# Patient Record
Sex: Female | Born: 1955 | Race: White | Hispanic: No | Marital: Married | Smoking: Never smoker
Health system: Southern US, Community
[De-identification: ages and names within clinical notes are randomized; demographics above are authoritative.]

## PROBLEM LIST (undated history)

## (undated) DIAGNOSIS — R0602 Shortness of breath: Secondary | ICD-10-CM

## (undated) DIAGNOSIS — U071 COVID-19: Secondary | ICD-10-CM

## (undated) DIAGNOSIS — J45909 Unspecified asthma, uncomplicated: Secondary | ICD-10-CM

## (undated) DIAGNOSIS — R51 Headache: Secondary | ICD-10-CM

## (undated) DIAGNOSIS — G4730 Sleep apnea, unspecified: Secondary | ICD-10-CM

## (undated) HISTORY — PX: BREAST SURGERY: SHX581

## (undated) HISTORY — PX: FOOT SURGERY: SHX648

## (undated) HISTORY — PX: WRIST SURGERY: SHX841

## (undated) HISTORY — PX: TONSILLECTOMY: SUR1361

## (undated) HISTORY — PX: KNEE ARTHROSCOPY: SUR90

## (undated) HISTORY — PX: ABDOMINAL HYSTERECTOMY: SHX81

---

## 1998-05-27 ENCOUNTER — Other Ambulatory Visit: Admission: RE | Admit: 1998-05-27 | Discharge: 1998-05-27 | Payer: Self-pay | Admitting: Specialist

## 1999-04-08 ENCOUNTER — Ambulatory Visit: Admission: RE | Admit: 1999-04-08 | Discharge: 1999-04-08 | Payer: Self-pay | Admitting: Occupational Medicine

## 1999-04-08 ENCOUNTER — Encounter: Payer: Self-pay | Admitting: Occupational Medicine

## 1999-04-15 ENCOUNTER — Encounter: Admission: RE | Admit: 1999-04-15 | Discharge: 1999-07-14 | Payer: Self-pay | Admitting: Occupational Medicine

## 2000-08-02 ENCOUNTER — Emergency Department (HOSPITAL_COMMUNITY): Admission: EM | Admit: 2000-08-02 | Discharge: 2000-08-02 | Payer: Self-pay | Admitting: *Deleted

## 2000-10-26 ENCOUNTER — Encounter: Payer: Self-pay | Admitting: Specialist

## 2000-10-26 ENCOUNTER — Encounter: Admission: RE | Admit: 2000-10-26 | Discharge: 2000-10-26 | Payer: Self-pay | Admitting: Specialist

## 2000-11-28 ENCOUNTER — Other Ambulatory Visit: Admission: RE | Admit: 2000-11-28 | Discharge: 2000-11-28 | Payer: Self-pay | Admitting: Specialist

## 2003-04-19 ENCOUNTER — Ambulatory Visit (HOSPITAL_BASED_OUTPATIENT_CLINIC_OR_DEPARTMENT_OTHER): Admission: RE | Admit: 2003-04-19 | Discharge: 2003-04-19 | Payer: Self-pay | Admitting: Family Medicine

## 2003-11-20 ENCOUNTER — Ambulatory Visit (HOSPITAL_COMMUNITY): Admission: RE | Admit: 2003-11-20 | Discharge: 2003-11-20 | Payer: Self-pay | Admitting: Family Medicine

## 2004-02-01 ENCOUNTER — Ambulatory Visit (HOSPITAL_COMMUNITY): Admission: RE | Admit: 2004-02-01 | Discharge: 2004-02-01 | Payer: Self-pay | Admitting: Internal Medicine

## 2004-04-04 ENCOUNTER — Ambulatory Visit (HOSPITAL_COMMUNITY): Admission: RE | Admit: 2004-04-04 | Discharge: 2004-04-04 | Payer: Self-pay | Admitting: *Deleted

## 2004-06-21 ENCOUNTER — Encounter: Admission: RE | Admit: 2004-06-21 | Discharge: 2004-06-21 | Payer: Self-pay | Admitting: Internal Medicine

## 2004-08-25 ENCOUNTER — Encounter: Admission: RE | Admit: 2004-08-25 | Discharge: 2004-08-25 | Payer: Self-pay | Admitting: Otolaryngology

## 2005-01-12 ENCOUNTER — Ambulatory Visit: Payer: Self-pay | Admitting: Internal Medicine

## 2005-01-19 ENCOUNTER — Ambulatory Visit (HOSPITAL_COMMUNITY): Admission: RE | Admit: 2005-01-19 | Discharge: 2005-01-19 | Payer: Self-pay | Admitting: Internal Medicine

## 2005-01-20 ENCOUNTER — Ambulatory Visit (HOSPITAL_COMMUNITY): Admission: RE | Admit: 2005-01-20 | Discharge: 2005-01-20 | Payer: Self-pay | Admitting: Internal Medicine

## 2005-02-09 ENCOUNTER — Ambulatory Visit: Payer: Self-pay | Admitting: Internal Medicine

## 2008-01-14 ENCOUNTER — Ambulatory Visit: Payer: Self-pay | Admitting: Internal Medicine

## 2009-02-19 ENCOUNTER — Emergency Department (HOSPITAL_COMMUNITY): Admission: EM | Admit: 2009-02-19 | Discharge: 2009-02-19 | Payer: Self-pay | Admitting: Emergency Medicine

## 2009-05-18 ENCOUNTER — Emergency Department (HOSPITAL_COMMUNITY): Admission: EM | Admit: 2009-05-18 | Discharge: 2009-05-18 | Payer: Self-pay | Admitting: Emergency Medicine

## 2009-05-27 ENCOUNTER — Encounter: Admission: RE | Admit: 2009-05-27 | Discharge: 2009-05-27 | Payer: Self-pay | Admitting: Orthopedic Surgery

## 2009-06-08 ENCOUNTER — Emergency Department (HOSPITAL_COMMUNITY): Admission: EM | Admit: 2009-06-08 | Discharge: 2009-06-08 | Payer: Self-pay | Admitting: Emergency Medicine

## 2009-06-11 ENCOUNTER — Encounter (HOSPITAL_COMMUNITY): Admission: RE | Admit: 2009-06-11 | Discharge: 2009-09-03 | Payer: Self-pay | Admitting: Emergency Medicine

## 2009-06-30 ENCOUNTER — Encounter: Admission: RE | Admit: 2009-06-30 | Discharge: 2009-06-30 | Payer: Self-pay | Admitting: Orthopedic Surgery

## 2009-08-31 IMAGING — RF DG MYELOGRAM LUMBAR
13 of 16 series · 13 of 16 positions shown · IV contrast (omnipaque)
Comparison: Radiography 05/18/2009

CLINICAL DATA: Left leg and thigh pain.  Low back pain.

 MYELOGRAM INJECTION
TECHNIQUE: Informed consent was obtained from the patient prior to
the procedure, including potential complications of headache,
allergy, infection and pain.  A timeout procedure was performed.
With the patient prone, the lower back was prepped with Betadine.
1% Lidocaine was used for local anesthesia.  Lumbar puncture was
performed at the right L3-4 level using a 22 gauge needle with
return of clear CSF.  18 ml of Omnipaque 186was injected into the
subarachnoid space .
TECHNIQUE: Following injection of intrathecal Omnipaque contrast,
spine imaging in multiple projections was performed using
fluoroscopy.
Fluoroscopy Time: 1 minute 25 seconds .
TECHNIQUE: CT imaging of the lumbar spine was performed after
intrathecal contrast administration.  Multiplanar CT image
reconstructions were also generated.

[Series 1: (hospital) · 1 of 1 slices shown]
[im 1/1]
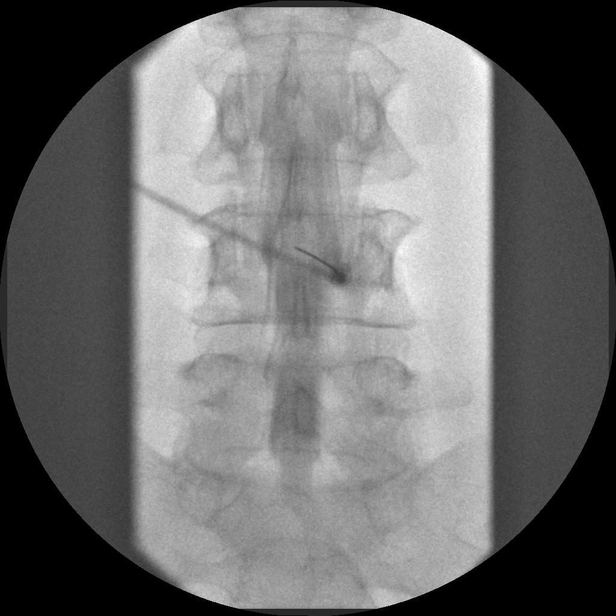

[Series 2: myelogram  white · 1 of 1 slices shown (1 of 12)]
[im 1/1]
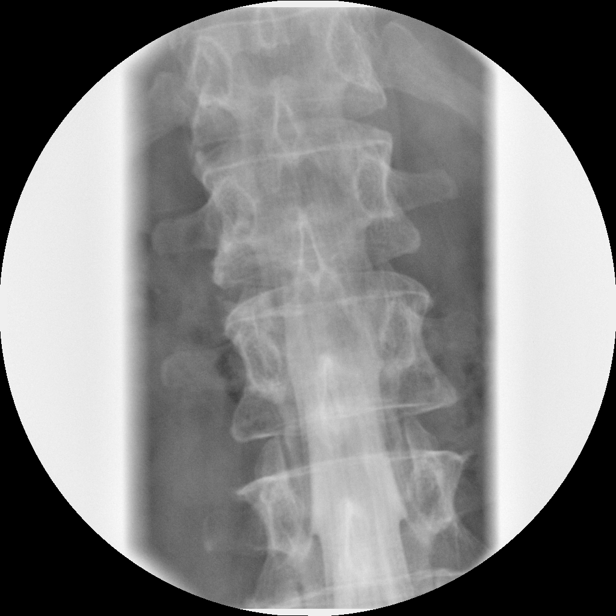

[Series 4: myelogram  white · 1 of 1 slices shown (2 of 12)]
[im 1/1]
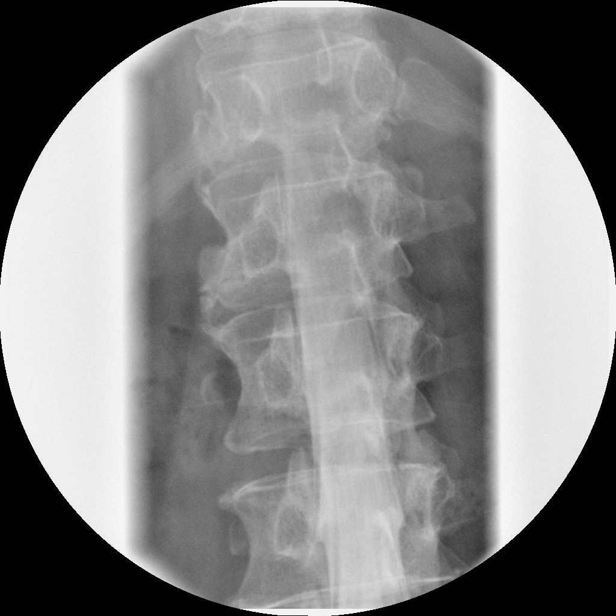

[Series 5: myelogram  white · 1 of 1 slices shown (3 of 12)]
[im 1/1]
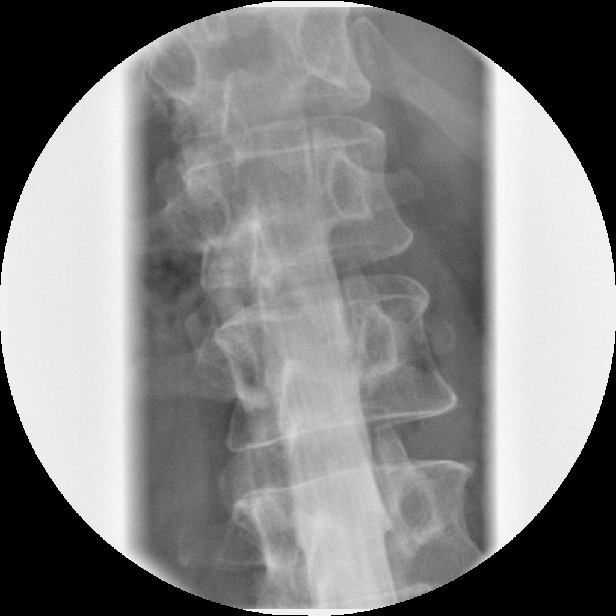

[Series 6: myelogram  white · 1 of 1 slices shown (4 of 12)]
[im 1/1]
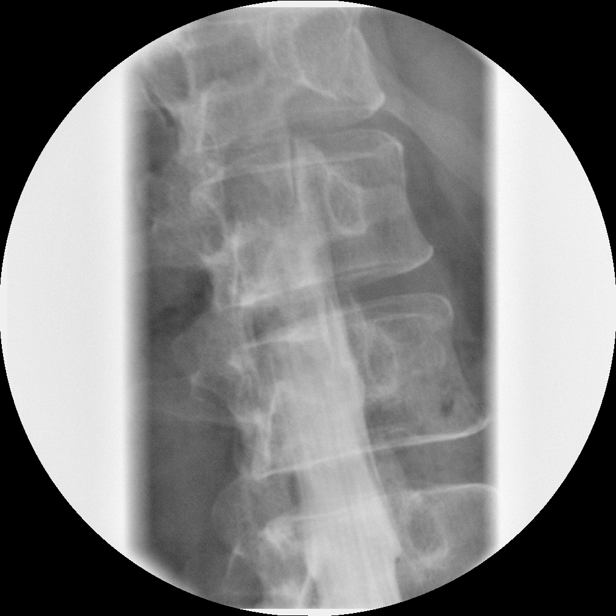

[Series 7: myelogram  white · 1 of 1 slices shown (5 of 12)]
[im 1/1]
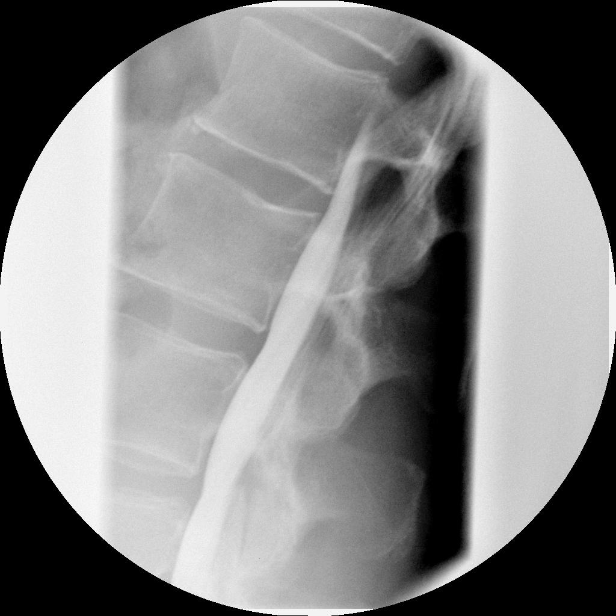

[Series 9: myelogram  white · 1 of 1 slices shown (6 of 12)]
[im 1/1]
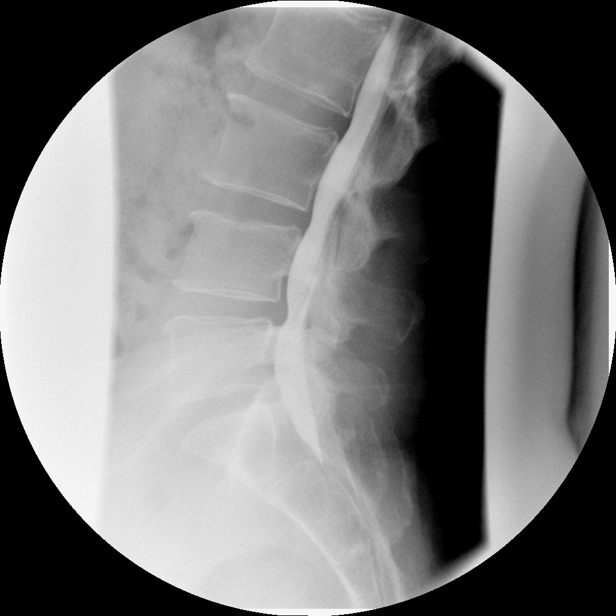

[Series 10: myelogram  white · 1 of 1 slices shown (7 of 12)]
[im 1/1]
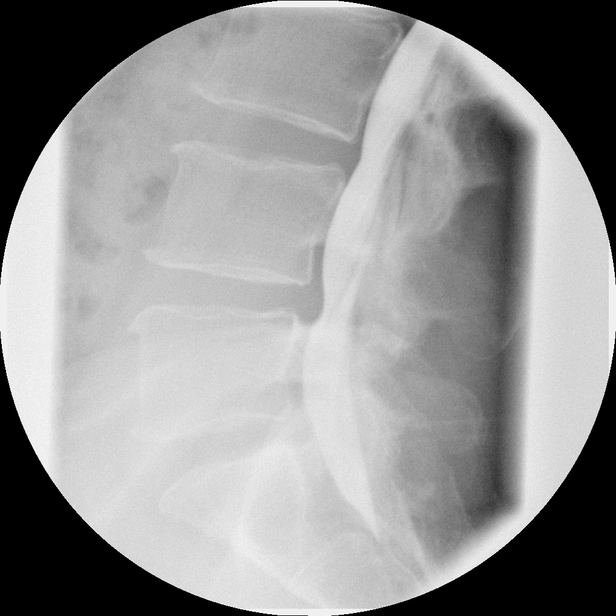

[Series 11: myelogram  white · 1 of 1 slices shown (8 of 12)]
[im 1/1]
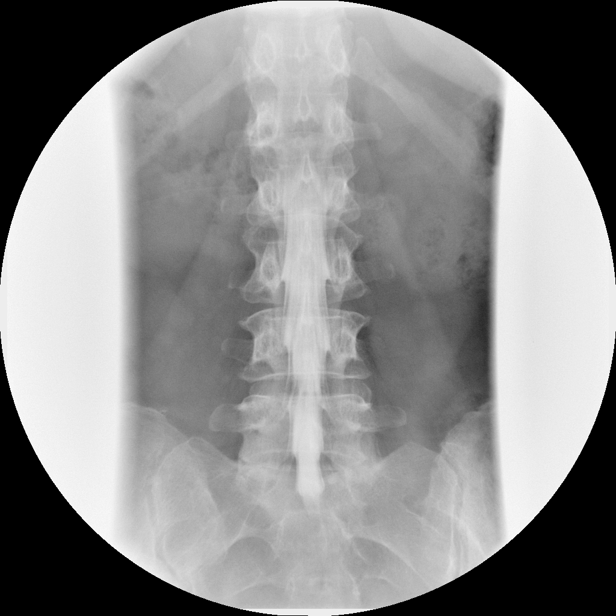

[Series 12: myelogram  white · 1 of 1 slices shown (9 of 12)]
[im 1/1]
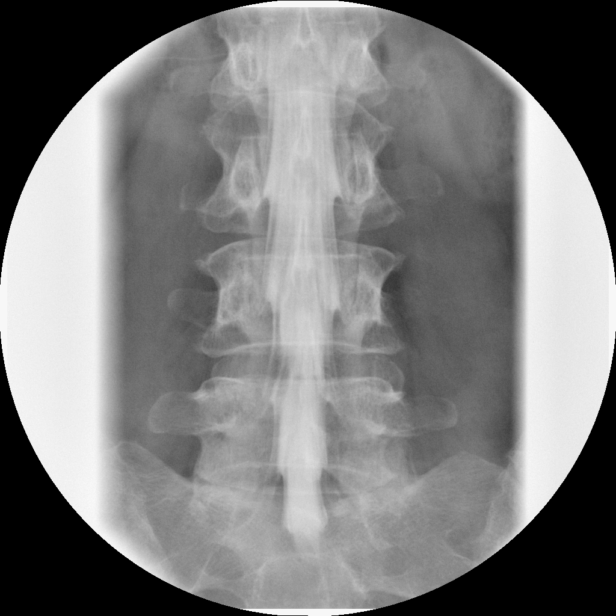

[Series 13: myelogram  white · 1 of 1 slices shown (10 of 12)]
[im 1/1]
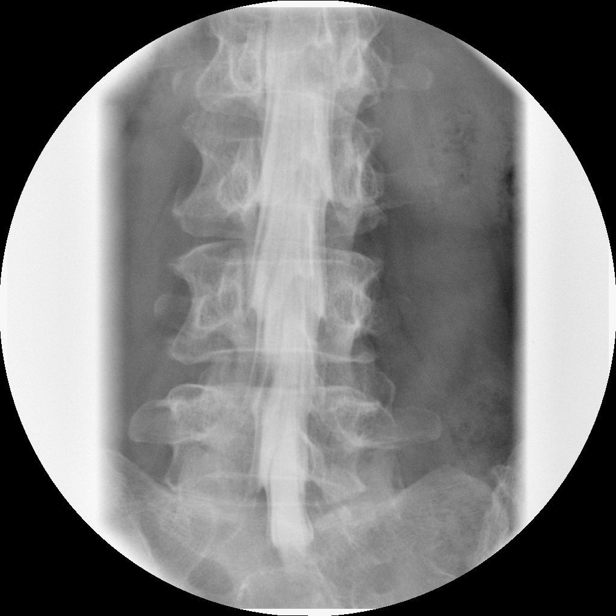

[Series 15: myelogram  white · 1 of 1 slices shown (11 of 12)]
[im 1/1]
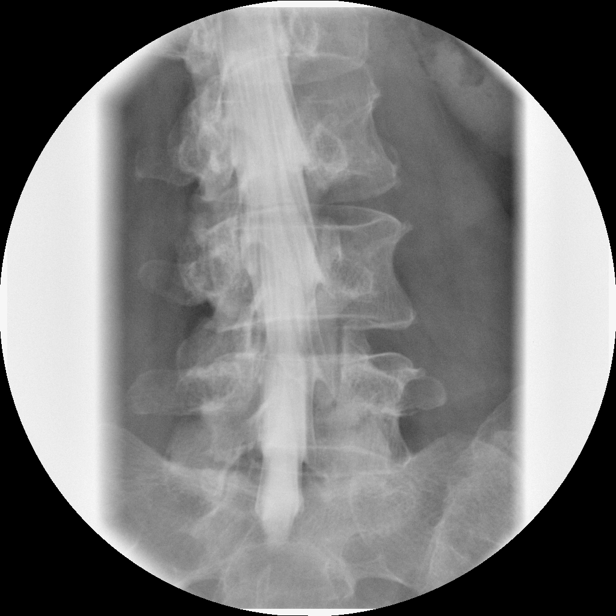

[Series 16: myelogram  white · 1 of 1 slices shown (12 of 12)]
[im 1/1]
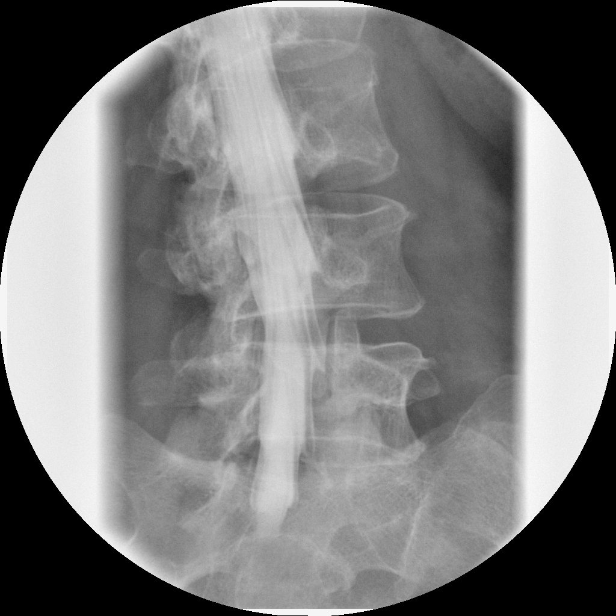

[13 of 16 positions shown; findings below may reference images not displayed]

IMPRESSION: Successful injection of  intrathecal contrast for myelography.

MYELOGRAM LUMBAR
FINDINGS: There is a small anterior extradural defect at L4-5.
There is mild narrowing of both lateral recesses at L4-5.  No other
stenosis is evident.
IMPRESSION: Small anterior extradural defect at L4-5 with mild narrowing of the
lateral recesses.

CT MYELOGRAPHY LUMBAR SPINE
FINDINGS: L1-2:  Normal.  Anterior osteophytes of no significance.

L2-3:  Normal

L3-4:  Mild bulging of the disc.  Mild facet degeneration
bilaterally.  No compressive stenosis.

L4-5:  Moderate circumferential disc protrusion.  Mild facet and
ligamentous hypertrophy.  Narrowing of the lateral recesses, left
more than right.  The left L5 nerve root could be affected.

L5-S1:  The disc is degenerated.  There is circumferential annular
bulging with calcification on the right side.  There is facet
arthropathy bilaterally, more on the right than the left.  There is
mild foraminal narrowing without definite compression of either L5
nerve root.  There is a calcified entity within the proximal
foramen on the right that looks like a piece of calcified extruded
disc material or conceivably be some material related to the L5-S1
facet joint.  This could focally compress the right S1 nerve root.
The possibility does exist this could represent a root sleeve cyst.
In order to sort this out, one could perform a limited CT of the
region after the contrast has cleared.
IMPRESSION: L3-4:  Disc bulge.  Facet degeneration.  No compressive stenosis.

L4-5:  Circumferential disc protrusion.  Facet and ligamentous
hypertrophy.  Narrowing of the lateral recesses, left more than
right.  Left L5 nerve root could be affected.

L5-S1:  Chronic annular bulging with calcification on the right.
Facet arthropathy right worse than left.  Calcified entity within
the proximal foramen on the right that could compress the right S1
nerve root.  This could be a calcified disc fragment or could be
some material derived from the degenerated facet joint on the
right. The possibility does exist that we could be full by a root
sleeve cyst.  In order to sort this out, one could perform a
limited CT after the contrast has cleared.

## 2009-10-13 ENCOUNTER — Ambulatory Visit (HOSPITAL_COMMUNITY): Admission: RE | Admit: 2009-10-13 | Discharge: 2009-10-13 | Payer: Self-pay | Admitting: Orthopedic Surgery

## 2011-03-15 LAB — COMPREHENSIVE METABOLIC PANEL
ALT: 26 U/L (ref 0–35)
AST: 22 U/L (ref 0–37)
Albumin: 3.2 g/dL — ABNORMAL LOW (ref 3.5–5.2)
Alkaline Phosphatase: 124 U/L — ABNORMAL HIGH (ref 39–117)
BUN: 13 mg/dL (ref 6–23)
CO2: 30 mEq/L (ref 19–32)
Calcium: 9.4 mg/dL (ref 8.4–10.5)
Chloride: 105 mEq/L (ref 96–112)
Creatinine, Ser: 0.87 mg/dL (ref 0.4–1.2)
GFR calc Af Amer: >60 mL/min (ref >60)
GFR calc non Af Amer: >60 mL/min (ref >60)
Glucose, Bld: 127 mg/dL — ABNORMAL HIGH (ref 70–99)
Potassium: 4.3 mEq/L (ref 3.5–5.1)
Sodium: 143 mEq/L (ref 135–145)
Total Bilirubin: 0.4 mg/dL (ref 0.3–1.2)
Total Protein: 6.6 g/dL (ref 6.0–8.3)

## 2011-03-15 LAB — CBC
HCT: 34.8 % — ABNORMAL LOW (ref 36.0–46.0)
Hemoglobin: 11.3 g/dL — ABNORMAL LOW (ref 12.0–15.0)
MCHC: 32.3 g/dL (ref 30.0–36.0)
MCV: 80.9 fL (ref 78.0–100.0)
Platelets: 309 10*3/uL (ref 150–400)
RBC: 4.31 MIL/uL (ref 3.87–5.11)
RDW: 16.5 % — ABNORMAL HIGH (ref 11.5–15.5)
WBC: 16.7 10*3/uL — ABNORMAL HIGH (ref 4.0–10.5)

## 2013-05-19 ENCOUNTER — Encounter (HOSPITAL_COMMUNITY): Payer: Self-pay | Admitting: Pharmacy Technician

## 2013-05-27 ENCOUNTER — Encounter (HOSPITAL_COMMUNITY): Payer: Self-pay

## 2013-05-27 ENCOUNTER — Encounter (HOSPITAL_COMMUNITY)
Admission: RE | Admit: 2013-05-27 | Discharge: 2013-05-27 | Disposition: A | Discharge status: Home / Self Care | Payer: Managed Care, Other (non HMO) | Source: Ambulatory Visit | Attending: Orthopedic Surgery | Admitting: Orthopedic Surgery

## 2013-05-27 ENCOUNTER — Encounter (HOSPITAL_COMMUNITY)
Admission: RE | Admit: 2013-05-27 | Discharge: 2013-05-27 | Disposition: A | Discharge status: Home / Self Care | Payer: Managed Care, Other (non HMO) | Source: Ambulatory Visit | Attending: Anesthesiology | Admitting: Anesthesiology

## 2013-05-27 HISTORY — DX: Headache: R51

## 2013-05-27 HISTORY — DX: Shortness of breath: R06.02

## 2013-05-27 HISTORY — DX: Unspecified asthma, uncomplicated: J45.909

## 2013-05-27 HISTORY — DX: Sleep apnea, unspecified: G47.30

## 2013-05-27 LAB — CBC
HCT: 38.3 % (ref 36.0–46.0)
Hemoglobin: 12.6 g/dL (ref 12.0–15.0)
MCH: 28.1 pg (ref 26.0–34.0)
MCHC: 32.9 g/dL (ref 30.0–36.0)
MCV: 85.5 fL (ref 78.0–100.0)
Platelets: 262 10*3/uL (ref 150–400)
RBC: 4.48 MIL/uL (ref 3.87–5.11)
RDW: 14.1 % (ref 11.5–15.5)
WBC: 7.8 10*3/uL (ref 4.0–10.5)

## 2013-05-27 LAB — SURGICAL PCR SCREEN
MRSA, PCR: NEGATIVE
Staphylococcus aureus: NEGATIVE

## 2013-05-27 NOTE — Progress Notes (Signed)
Sleep Study requested from [facility].

## 2013-05-27 NOTE — Pre-Procedure Instructions (Signed)
Virginia Bailey  05/27/2013   Your procedure is scheduled on:  05-30-2013  Friday    Report to [facility] at 11:00 AM.Enter thru Main Entrance and take the [facility] to the 3rd floor. Check in at the Short Stay Desk.    Call this number if you have problems the morning of surgery: [phone]   Remember:   Do not eat food or drink liquids after midnight.    Take these medicines the morning of surgery with A SIP OF WATER: none   Do not wear jewelry, make-up or nail polish.  Do not wear lotions, powders, or perfumes.   Do not shave 48 hours prior to surgery.   Do not bring valuables to the hospital.  [facility] is not responsible for any belongings or valuables.   Contacts, dentures or bridgework may not be worn into surgery.  Leave suitcase in the car. After surgery it may be brought to your room.   For patients admitted to the hospital, checkout time is 11:00 AM the day of discharge.   Patients discharged the day of surgery will not be allowed to drive home.    Name and phone number of your driver:     Special Instructions: Shower using CHG 2 nights before surgery and the night before surgery.  If you shower the day of surgery use CHG.  Use special wash - you have one bottle of CHG for all showers.  You should use approximately 1/3 of the bottle for each shower.   Please read over the following fact sheets that you were given: Pain Booklet and Surgical Site Infection Prevention

## 2013-05-28 ENCOUNTER — Encounter (HOSPITAL_COMMUNITY): Payer: Self-pay

## 2013-05-28 NOTE — Progress Notes (Signed)
Anesthesia Chart Review:  Patient is a 57 year old female scheduled for right knee arthroscopy, aspiration of Baker's Cyst on 05/30/13 by Dr. Ranell Patrick.  History includes non-smoker, obesity, asthma, OSA, migraines, hysterectomy, tonsillectomy, breast cyst excision.  PCP is Dr. Doreatha Martin who cleared patient for this procedure.    CXR on 05/27/13 showed no edema or consolidation.  Preoperative CBC WNL.  Velna Ochs [facility]/Anesthesiology Phone [phone] 05/28/2013 11:00 AM

## 2013-05-28 NOTE — H&P (Signed)
Virginia Bailey is an 57 y.o. female.    Chief Complaint: right knee pain  HPI: Pt is a 57 y.o. female complaining of right knee pain for several months. Pain had continually increased since the beginning.  Pt has tried various conservative treatments which have failed to alleviate their symptoms, including shots and exercises. Various options are discussed with the patient. Risks, benefits and expectations were discussed with the patient. Patient understand the risks, benefits and expectations and wishes to proceed with surgery.   PCP:  Doreatha Martin, MD  D/C Plans:  Home   PMH: Past Medical History  Diagnosis Date  . Shortness of breath     restrictive pulmonary disease  . Asthma   . Sleep apnea     CPAP  . Seizures     migraines    PSH: Past Surgical History  Procedure Laterality Date  . Abdominal hysterectomy    . Tonsillectomy    . Breast surgery Bilateral     cyst removes  . Knee arthroscopy Left   . Foot surgery Left     removal of tumors  . Wrist surgery Right     cyst removed    Social History:  reports that she has never smoked. She does not have any smokeless tobacco history on file. She reports that  drinks alcohol. She reports that she does not use illicit drugs.  Allergies:  No Known Allergies  Medications: No current facility-administered medications for this encounter.   No current outpatient prescriptions on file.    Results for orders placed during the hospital encounter of 05/27/13 (from the past 48 hour(s))  SURGICAL PCR SCREEN     Status: None   Collection Time    05/27/13  1:35 PM      Result Value Range   MRSA, PCR NEGATIVE  NEGATIVE   Staphylococcus aureus NEGATIVE  NEGATIVE   Comment:            The Xpert SA Assay (FDA     approved for NASAL specimens     in patients over 42 years of age),     is one component of     a comprehensive surveillance     program.  Test performance has     been validated by [employer] for  patients greater     than or equal to 42 year old.     It is not intended     to diagnose infection nor to     guide or monitor treatment.  CBC     Status: None   Collection Time    05/27/13  1:36 PM      Result Value Range   WBC 7.8  4.0 - 10.5 K/uL   RBC 4.48  3.87 - 5.11 MIL/uL   Hemoglobin 12.6  12.0 - 15.0 g/dL   HCT 13.0  86.5 - 78.4 %   MCV 85.5  78.0 - 100.0 fL   MCH 28.1  26.0 - 34.0 pg   MCHC 32.9  30.0 - 36.0 g/dL   RDW 69.6  29.5 - 28.4 %   Platelets 262  150 - 400 K/uL   Dg Chest 2 View  05/27/2013   *RADIOLOGY REPORT*  Clinical Data: Asthma; preoperative knee arthroplasty  CHEST - 2 VIEW  Comparison: October 12, 2009  Findings:  Lungs clear.  Heart size and pulmonary vascularity are normal.  No adenopathy.  There is degenerative change in the thoracic spine.  IMPRESSION: No edema or consolidation.   Original Report Authenticated By: Bretta Bang, M.D.    ROS: Pain with rom of the right lower extremity  Physical Exam: Alert and oriented 57 y.o. female in no acute distress Cranial nerves 2-12 intact Cervical spine: full rom with no tenderness, nv intact distally Chest: active breath sounds bilaterally, no wheeze rhonchi or rales Heart: regular rate and rhythm, no murmur Abd: non tender non distended with active bowel sounds Hip is stable with rom  Mild medial joint line tenderness right knee nv intact distally Minimally antalgic gait  Assessment/Plan Assessment: right knee pain secondary to medial meniscal tear   Plan: Patient will undergo a right knee arthroscopy by Dr. Ranell Patrick at [facility]. Risks benefits and expectations were discussed with the patient. Patient understand risks, benefits and expectations and wishes to proceed.

## 2013-05-29 MED ORDER — CEFAZOLIN SODIUM-DEXTROSE 2-3 GM-% IV SOLR
2.0000 g | INTRAVENOUS | Status: AC
  Administered 2013-05-30: 2 g via INTRAVENOUS
  Filled 2013-05-29: qty 50

## 2013-05-29 NOTE — Progress Notes (Signed)
Pt notified of surgery start time change.  Pt instructed to be here at 9:30am tomorrow.

## 2013-05-30 ENCOUNTER — Encounter (HOSPITAL_COMMUNITY)
Admission: RE | Disposition: A | Discharge status: Home / Self Care | Payer: Self-pay | Source: Ambulatory Visit | Attending: Orthopedic Surgery

## 2013-05-30 ENCOUNTER — Ambulatory Visit (HOSPITAL_COMMUNITY)
Admission: RE | Admit: 2013-05-30 | Discharge: 2013-05-30 | Disposition: A | Discharge status: Home / Self Care | Payer: Managed Care, Other (non HMO) | Source: Ambulatory Visit | Attending: Orthopedic Surgery | Admitting: Orthopedic Surgery

## 2013-05-30 ENCOUNTER — Encounter (HOSPITAL_COMMUNITY): Payer: Self-pay | Admitting: Vascular Surgery

## 2013-05-30 ENCOUNTER — Ambulatory Visit (HOSPITAL_COMMUNITY): Payer: Managed Care, Other (non HMO) | Admitting: Vascular Surgery

## 2013-05-30 ENCOUNTER — Encounter (HOSPITAL_COMMUNITY): Payer: Self-pay | Admitting: *Deleted

## 2013-05-30 DIAGNOSIS — M7120 Synovial cyst of popliteal space [Baker], unspecified knee: Secondary | ICD-10-CM | POA: Insufficient documentation

## 2013-05-30 DIAGNOSIS — M2240 Chondromalacia patellae, unspecified knee: Secondary | ICD-10-CM | POA: Insufficient documentation

## 2013-05-30 DIAGNOSIS — IMO0002 Reserved for concepts with insufficient information to code with codable children: Secondary | ICD-10-CM | POA: Insufficient documentation

## 2013-05-30 DIAGNOSIS — M2390 Unspecified internal derangement of unspecified knee: Secondary | ICD-10-CM | POA: Insufficient documentation

## 2013-05-30 DIAGNOSIS — X58XXXA Exposure to other specified factors, initial encounter: Secondary | ICD-10-CM | POA: Insufficient documentation

## 2013-05-30 HISTORY — PX: KNEE ARTHROSCOPY WITH EXCISION BAKER'S CYST: SHX5646

## 2013-05-30 SURGERY — ARTHROSCOPY, KNEE, WITH SYNOVIAL CYST EXCISION OF POPLITEAL SPACE
Anesthesia: General | Site: Knee | Laterality: Right | Wound class: Clean

## 2013-05-30 MED ORDER — LIDOCAINE HCL (CARDIAC) 20 MG/ML IV SOLN
INTRAVENOUS | Status: DC | PRN
  Administered 2013-05-30: 100 mg via INTRAVENOUS

## 2013-05-30 MED ORDER — BUPIVACAINE-EPINEPHRINE PF 0.25-1:200000 % IJ SOLN
INTRAMUSCULAR | Status: DC | PRN
  Administered 2013-05-30: 10 mL

## 2013-05-30 MED ORDER — OXYCODONE HCL 5 MG PO TABS: 5.0000 mg | ORAL_TABLET | Freq: Once | ORAL | Status: DC | PRN

## 2013-05-30 MED ORDER — HYDROMORPHONE HCL PF 1 MG/ML IJ SOLN
INTRAMUSCULAR | Status: AC
  Filled 2013-05-30: qty 1

## 2013-05-30 MED ORDER — ONDANSETRON HCL 4 MG/2ML IJ SOLN
INTRAMUSCULAR | Status: DC | PRN
  Administered 2013-05-30: 4 mg via INTRAVENOUS

## 2013-05-30 MED ORDER — MIDAZOLAM HCL 5 MG/5ML IJ SOLN
INTRAMUSCULAR | Status: DC | PRN
  Administered 2013-05-30: 2 mg via INTRAVENOUS

## 2013-05-30 MED ORDER — METHOCARBAMOL 500 MG PO TABS: 500.0000 mg | ORAL_TABLET | Freq: Three times a day (TID) | ORAL | Status: DC | PRN

## 2013-05-30 MED ORDER — PROPOFOL 10 MG/ML IV BOLUS
INTRAVENOUS | Status: DC | PRN
  Administered 2013-05-30: 200 mg via INTRAVENOUS

## 2013-05-30 MED ORDER — LACTATED RINGERS IV SOLN
INTRAVENOUS | Status: DC
  Administered 2013-05-30: 11:00:00 via INTRAVENOUS

## 2013-05-30 MED ORDER — FENTANYL CITRATE 0.05 MG/ML IJ SOLN
INTRAMUSCULAR | Status: DC | PRN
  Administered 2013-05-30 (×2): 50 ug via INTRAVENOUS
  Administered 2013-05-30: 100 ug via INTRAVENOUS
  Administered 2013-05-30: 50 ug via INTRAVENOUS

## 2013-05-30 MED ORDER — OXYCODONE-ACETAMINOPHEN 5-325 MG PO TABS: 1.0000 | ORAL_TABLET | ORAL | Status: DC | PRN

## 2013-05-30 MED ORDER — PROMETHAZINE HCL 25 MG/ML IJ SOLN: 6.2500 mg | INTRAMUSCULAR | Status: DC | PRN

## 2013-05-30 MED ORDER — BUPIVACAINE-EPINEPHRINE PF 0.25-1:200000 % IJ SOLN
INTRAMUSCULAR | Status: AC
  Filled 2013-05-30: qty 30

## 2013-05-30 MED ORDER — CHLORHEXIDINE GLUCONATE 4 % EX LIQD: 60.0000 mL | Freq: Once | CUTANEOUS | Status: DC

## 2013-05-30 MED ORDER — HYDROMORPHONE HCL PF 1 MG/ML IJ SOLN
0.2500 mg | INTRAMUSCULAR | Status: DC | PRN
  Administered 2013-05-30: 0.25 mg via INTRAVENOUS

## 2013-05-30 MED ORDER — KETOROLAC TROMETHAMINE 30 MG/ML IJ SOLN
30.0000 mg | Freq: Once | INTRAMUSCULAR | Status: AC
  Administered 2013-05-30: 30 mg via INTRAVENOUS

## 2013-05-30 MED ORDER — OXYCODONE HCL 5 MG/5ML PO SOLN: 5.0000 mg | Freq: Once | ORAL | Status: DC | PRN

## 2013-05-30 MED ORDER — SODIUM CHLORIDE 0.9 % IR SOLN
Status: DC | PRN
  Administered 2013-05-30: 3000 mL
  Administered 2013-05-30: 1000 mL
  Administered 2013-05-30: 3000 mL

## 2013-05-30 SURGICAL SUPPLY — 44 items
BANDAGE ELASTIC 4 VELCRO ST LF (GAUZE/BANDAGES/DRESSINGS) ×1 IMPLANT
BANDAGE ELASTIC 6 VELCRO ST LF (GAUZE/BANDAGES/DRESSINGS) ×1 IMPLANT
BANDAGE GAUZE ELAST BULKY 4 IN (GAUZE/BANDAGES/DRESSINGS) ×3 IMPLANT
BLADE CUTTER GATOR 3.5 (BLADE) ×2 IMPLANT
CLOTH BEACON ORANGE TIMEOUT ST (SAFETY) ×2 IMPLANT
COVER SURGICAL LIGHT HANDLE (MISCELLANEOUS) ×2 IMPLANT
DRAPE ARTHROSCOPY W/POUCH 114 (DRAPES) ×2 IMPLANT
DRSG EMULSION OIL 3X3 NADH (GAUZE/BANDAGES/DRESSINGS) ×1 IMPLANT
DRSG PAD ABDOMINAL 8X10 ST (GAUZE/BANDAGES/DRESSINGS) ×2 IMPLANT
DURAPREP 26ML APPLICATOR (WOUND CARE) ×2 IMPLANT
ELECT MENISCUS 165MM 90D (ELECTRODE) IMPLANT
ELECT REM PT RETURN 9FT ADLT (ELECTROSURGICAL) ×2
ELECTRODE REM PT RTRN 9FT ADLT (ELECTROSURGICAL) ×1 IMPLANT
GAUZE SPONGE 4X4 16PLY XRAY LF (GAUZE/BANDAGES/DRESSINGS) ×2 IMPLANT
GLOVE BIOGEL PI IND STRL 6.5 (GLOVE) IMPLANT
GLOVE BIOGEL PI IND STRL 7.0 (GLOVE) IMPLANT
GLOVE BIOGEL PI INDICATOR 6.5 (GLOVE) ×1
GLOVE BIOGEL PI INDICATOR 7.0 (GLOVE) ×1
GLOVE BIOGEL PI ORTHO PRO 7.5 (GLOVE) ×1
GLOVE BIOGEL PI ORTHO PRO SZ8 (GLOVE) ×1
GLOVE ECLIPSE 7.0 STRL STRAW (GLOVE) ×1 IMPLANT
GLOVE ORTHO TXT STRL SZ7.5 (GLOVE) ×2 IMPLANT
GLOVE PI ORTHO PRO STRL 7.5 (GLOVE) ×1 IMPLANT
GLOVE PI ORTHO PRO STRL SZ8 (GLOVE) ×1 IMPLANT
GLOVE SURG ORTHO 8.5 STRL (GLOVE) ×2 IMPLANT
GLOVE SURG SS PI 6.5 STRL IVOR (GLOVE) ×1 IMPLANT
GOWN STRL NON-REIN LRG LVL3 (GOWN DISPOSABLE) ×3 IMPLANT
GOWN STRL REIN XL XLG (GOWN DISPOSABLE) ×5 IMPLANT
KIT BASIN OR (CUSTOM PROCEDURE TRAY) ×2 IMPLANT
KIT ROOM TURNOVER OR (KITS) ×2 IMPLANT
MANIFOLD NEPTUNE II (INSTRUMENTS) ×2 IMPLANT
PACK ARTHROSCOPY DSU (CUSTOM PROCEDURE TRAY) ×2 IMPLANT
PAD ARMBOARD 7.5X6 YLW CONV (MISCELLANEOUS) ×4 IMPLANT
PENCIL BUTTON HOLSTER BLD 10FT (ELECTRODE) IMPLANT
SET ARTHROSCOPY TUBING (MISCELLANEOUS) ×2
SET ARTHROSCOPY TUBING LN (MISCELLANEOUS) ×1 IMPLANT
SPONGE GAUZE 4X4 12PLY (GAUZE/BANDAGES/DRESSINGS) ×1 IMPLANT
STRIP CLOSURE SKIN 1/2X4 (GAUZE/BANDAGES/DRESSINGS) ×2 IMPLANT
SUT MNCRL AB 4-0 PS2 18 (SUTURE) ×2 IMPLANT
TOWEL OR 17X24 6PK STRL BLUE (TOWEL DISPOSABLE) ×4 IMPLANT
TUBE CONNECTING 12X1/4 (SUCTIONS) ×2 IMPLANT
WAND 90 DEG TURBOVAC W/CORD (SURGICAL WAND) IMPLANT
WATER STERILE IRR 1000ML POUR (IV SOLUTION) ×2 IMPLANT
WRAP KNEE MAXI GEL POST OP (GAUZE/BANDAGES/DRESSINGS) ×2 IMPLANT

## 2013-05-30 NOTE — Op Note (Signed)
NAME:  SOSHA, SHEPHERD NO.:  [id number]  MEDICAL RECORD NO.:  [id number]  LOCATION:  [facility]                         FACILITY:  [facility]  PHYSICIAN:  Almedia Balls. Ranell Patrick, M.D. DATE OF BIRTH:  1956-08-02  DATE OF PROCEDURE:  05/30/2013 DATE OF DISCHARGE:                              OPERATIVE REPORT   PREOPERATIVE DIAGNOSIS:  Right knee medial meniscus tear.  POSTOPERATIVE DIAGNOSES: 1. Right knee medial meniscus tear 2. Right knee tricompartmental chondromalacia grade 3.  PROCEDURE PERFORMED:  Right knee arthroscopy, partial medial meniscectomy, and chondroplasty not to bleeding bone, 3 compartments.  ATTENDING SURGEON:  Almedia Balls. Ranell Patrick, MD  ASSISTANT:  None.  ANESTHESIA:  LMA general anesthesia was used.  ESTIMATED BLOOD LOSS:  Minimal.  FLUID REPLACEMENT:  1200 mL crystalloids.  INSTRUMENT COUNTS:  Correct.  COMPLICATIONS:  There were no complications.  ANTIBIOTICS:  Perioperative antibiotics were given.  INDICATIONS:  The patient is a 57 year old female with worsening right medial knee pain secondary to torn medial meniscus.  The patient has failed conservative management.  Desires operative treatment to restore function, eliminate pain to her knee.  Informed consent was obtained.  DESCRIPTION OF PROCEDURE:  After adequate level of anesthesia achieved, the patient was positioned in the supine position.  The right leg was correctly identified and sterilely prepped and draped in the usual manner.  Time-out was called.  Left leg was well padded and secured appropriately.  After a time-out, we entered the knee in standard portals including superolateral outflow, anterolateral scope, and anteromedial working portals.  We identified significant chondromalacia present within the patellofemoral joint.  Loose flaps and fibrillation of cartilage were noted.  We performed tangential chondroplasty using motorized shaver, removing the unstable cartilage.  We  transitioned from the damaged areas to the normal areas doubling that tissue, but not removing any stable cartilage.  Medial and lateral gutters inspected, no loose bodies encountered.  Medial compartment entered. There was weightbearing partial-thickness cartilage loss with loose flaps and fibrillation.  Performed again a chondroplasty not to bleeding bone, medial compartment.  Complex posterior horn of medial meniscus tear identified.  We performed a partial meniscectomy removing about 30% of the posterior horn of meniscus.  Remaining meniscus was probed and felt to be stable.  The ACL, PCL intact.  The articular cartilage and lateral compartment had grade 3 chondromalacia.  Again weightbearing portion was localized to several areas.  Loose flaps and fibrillation identified and removed those using a tangential chondroplasty technique.  Lateral meniscus was intact.  Following completion of our partial meniscectomy, chondroplasty, 3 compartments, we concluded surgery.  Suturing wounds with 4-0 Monocryl followed by Steri-Strips and sterile compressive bandage.  The patient tolerated the surgery well.     Almedia Balls. Ranell Patrick, M.D.     SRN/MEDQ  D:  05/30/2013  T:  05/30/2013  Job:  [id number]

## 2013-05-30 NOTE — Transfer of Care (Signed)
Immediate Anesthesia Transfer of Care Note  Patient: Virginia Bailey  Procedure(s) Performed: Procedure(s) with comments: RIGHT KNEE ARTHROSCOPY WITH ASPIRATION BAKER'S CYST (Right) - Medial, Lateral and Patella Joint Chrondroplasty  Patient Location: PACU  Anesthesia Type:General  Level of Consciousness: awake, alert  and oriented  Airway & Oxygen Therapy: Patient Spontanous Breathing and Patient connected to nasal cannula oxygen  Post-op Assessment: Report given to PACU RN, Post -op Vital signs reviewed and stable and Patient moving all extremities X 4  Post vital signs: Reviewed and stable  Complications: No apparent anesthesia complications

## 2013-05-30 NOTE — Interval H&P Note (Signed)
History and Physical Interval Note:  05/30/2013 11:43 AM  Virginia Bailey  has presented today for surgery, with the diagnosis of RIGHT KNEE MEDIAL MENISCAL TEAR AND BAKERS CYST  The various methods of treatment have been discussed with the patient and family. After consideration of risks, benefits and other options for treatment, the patient has consented to  Procedure(s): RIGHT KNEE ARTHROSCOPY WITH ASPIRATION BAKER'S CYST (Right) as a surgical intervention .  The patient's history has been reviewed, patient examined, no change in status, stable for surgery.  I have reviewed the patient's chart and labs.  Questions were answered to the patient's satisfaction.     Weslie Rasmus,STEVEN R

## 2013-05-30 NOTE — Anesthesia Preprocedure Evaluation (Addendum)
Anesthesia Evaluation  Patient identified by MRN, date of birth, ID band Patient awake    Reviewed: Allergy & Precautions, H&P , NPO status , Patient's Chart, lab work & pertinent test results  Airway Mallampati: I      Dental  (+) Teeth Intact   Pulmonary shortness of breath and with exertion, asthma , sleep apnea and Continuous Positive Airway Pressure Ventilation ,          Cardiovascular     Neuro/Psych  Headaches,    GI/Hepatic   Endo/Other    Renal/GU      Musculoskeletal   Abdominal   Peds  Hematology   Anesthesia Other Findings   Reproductive/Obstetrics                          Anesthesia Physical Anesthesia Plan  ASA: II  Anesthesia Plan: General   Post-op Pain Management:    Induction: Intravenous  Airway Management Planned: Oral ETT  Additional Equipment:   Intra-op Plan:   Post-operative Plan: Extubation in OR  Informed Consent: I have reviewed the patients History and Physical, chart, labs and discussed the procedure including the risks, benefits and alternatives for the proposed anesthesia with the patient or authorized representative who has indicated his/her understanding and acceptance.     Plan Discussed with: CRNA and Anesthesiologist  Anesthesia Plan Comments:         Anesthesia Quick Evaluation

## 2013-05-30 NOTE — Preoperative (Signed)
Beta Blockers   Reason not to administer Beta Blockers:Not Applicable 

## 2013-05-30 NOTE — Brief Op Note (Signed)
05/30/2013  12:46 PM  PATIENT:  Virginia Bailey  57 y.o. female  PRE-OPERATIVE DIAGNOSIS:  RIGHT KNEE MEDIAL MENISCAL TEAR AND BAKERS CYST  POST-OPERATIVE DIAGNOSIS:  RIGHT KNEE MEDIAL MENISCAL TEAR   PROCEDURE:  Procedure(s) with comments: RIGHT KNEE ARTHROSCOPY WITH ASPIRATION BAKER'S CYST (Right) - Medial, Lateral and Patella Joint Chrondroplasty Partial medial meniscectomy SURGEON:  Surgeon(s) and Role:    * Verlee Rossetti, MD - Primary  PHYSICIAN ASSISTANT:   ASSISTANTS: none   ANESTHESIA:   general  EBL:     BLOOD ADMINISTERED:none  DRAINS: none   LOCAL MEDICATIONS USED:  MARCAINE     SPECIMEN:  No Specimen  DISPOSITION OF SPECIMEN:  N/A  COUNTS:  YES  TOURNIQUET:  * No tourniquets in log *  DICTATION: .Other Dictation: Dictation Number [id number]  PLAN OF CARE: Discharge to home after PACU  PATIENT DISPOSITION:  PACU - hemodynamically stable.   Delay start of Pharmacological VTE agent (>24hrs) due to surgical blood loss or risk of bleeding: not applicable

## 2013-05-30 NOTE — Anesthesia Procedure Notes (Signed)
Procedure Name: Intubation Date/Time: 05/30/2013 12:00 PM Performed by: Sharlene Dory E Pre-anesthesia Checklist: Patient identified, Emergency Drugs available, Suction available, Patient being monitored and Timeout performed Patient Re-evaluated:Patient Re-evaluated prior to inductionOxygen Delivery Method: Circle system utilized Preoxygenation: Pre-oxygenation with 100% oxygen Intubation Type: IV induction LMA: LMA inserted LMA Size: 4.0 Number of attempts: 1 Placement Confirmation: positive ETCO2 and breath sounds checked- equal and bilateral Tube secured with: Tape Dental Injury: Teeth and Oropharynx as per pre-operative assessment

## 2013-05-30 NOTE — Anesthesia Postprocedure Evaluation (Signed)
  Anesthesia Post-op Note  Patient: Virginia Bailey  Procedure(s) Performed: Procedure(s) with comments: RIGHT KNEE ARTHROSCOPY WITH ASPIRATION BAKER'S CYST (Right) - Medial, Lateral and Patella Joint Chrondroplasty  Patient Location: PACU  Anesthesia Type:General  Level of Consciousness: awake  Airway and Oxygen Therapy: Patient Spontanous Breathing  Post-op Pain: mild  Post-op Assessment: Post-op Vital signs reviewed, Patient's Cardiovascular Status Stable, Respiratory Function Stable, Patent Airway, No signs of Nausea or vomiting and Pain level controlled  Post-op Vital Signs: stable  Complications: No apparent anesthesia complications

## 2013-05-30 NOTE — Progress Notes (Signed)
Called to give report to [facility] . She will call me back

## 2013-06-03 ENCOUNTER — Encounter (HOSPITAL_COMMUNITY): Payer: Self-pay | Admitting: Orthopedic Surgery

## 2013-06-10 NOTE — Addendum Note (Signed)
Addendum created 06/10/13 1326 by Bedelia Person, MD   Modules edited: Anesthesia Responsible Staff

## 2015-11-17 ENCOUNTER — Encounter (HOSPITAL_COMMUNITY): Payer: Self-pay | Admitting: Emergency Medicine

## 2015-11-17 ENCOUNTER — Emergency Department (HOSPITAL_COMMUNITY)
Admission: EM | Admit: 2015-11-17 | Discharge: 2015-11-17 | Disposition: A | Discharge status: Home / Self Care | Payer: Managed Care, Other (non HMO) | Attending: Emergency Medicine | Admitting: Emergency Medicine

## 2015-11-17 DIAGNOSIS — G4730 Sleep apnea, unspecified: Secondary | ICD-10-CM | POA: Insufficient documentation

## 2015-11-17 DIAGNOSIS — Z9981 Dependence on supplemental oxygen: Secondary | ICD-10-CM | POA: Insufficient documentation

## 2015-11-17 DIAGNOSIS — Y92410 Unspecified street and highway as the place of occurrence of the external cause: Secondary | ICD-10-CM | POA: Insufficient documentation

## 2015-11-17 DIAGNOSIS — S3992XA Unspecified injury of lower back, initial encounter: Secondary | ICD-10-CM | POA: Insufficient documentation

## 2015-11-17 DIAGNOSIS — Z79899 Other long term (current) drug therapy: Secondary | ICD-10-CM | POA: Insufficient documentation

## 2015-11-17 DIAGNOSIS — Y998 Other external cause status: Secondary | ICD-10-CM | POA: Insufficient documentation

## 2015-11-17 DIAGNOSIS — Y9389 Activity, other specified: Secondary | ICD-10-CM | POA: Insufficient documentation

## 2015-11-17 DIAGNOSIS — M25512 Pain in left shoulder: Secondary | ICD-10-CM

## 2015-11-17 DIAGNOSIS — J45901 Unspecified asthma with (acute) exacerbation: Secondary | ICD-10-CM | POA: Insufficient documentation

## 2015-11-17 DIAGNOSIS — Z7951 Long term (current) use of inhaled steroids: Secondary | ICD-10-CM | POA: Insufficient documentation

## 2015-11-17 DIAGNOSIS — S0990XA Unspecified injury of head, initial encounter: Secondary | ICD-10-CM | POA: Insufficient documentation

## 2015-11-17 DIAGNOSIS — S4992XA Unspecified injury of left shoulder and upper arm, initial encounter: Secondary | ICD-10-CM | POA: Insufficient documentation

## 2015-11-17 DIAGNOSIS — W208XXA Other cause of strike by thrown, projected or falling object, initial encounter: Secondary | ICD-10-CM | POA: Insufficient documentation

## 2015-11-17 MED ORDER — NAPROXEN 250 MG PO TABS: 250.0000 mg | ORAL_TABLET | Freq: Two times a day (BID) | ORAL | Status: DC

## 2015-11-17 MED ORDER — METHOCARBAMOL 500 MG PO TABS: 500.0000 mg | ORAL_TABLET | Freq: Two times a day (BID) | ORAL | Status: DC | PRN
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
## 2015-11-17 NOTE — ED Notes (Signed)
Declined W/C at D/C and was escorted to lobby by RN. 
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
## 2015-11-17 NOTE — ED Notes (Signed)
Pt ambulated to the room with ease

## 2017-08-30 ENCOUNTER — Encounter: Payer: No Typology Code available for payment source | Admitting: Podiatry

## 2017-08-30 NOTE — Patient Instructions (Signed)

## 2017-09-06 NOTE — Progress Notes (Signed)
This encounter was created in error - please disregard.

## 2019-09-30 ENCOUNTER — Other Ambulatory Visit: Payer: Self-pay

## 2019-09-30 DIAGNOSIS — Z20822 Contact with and (suspected) exposure to covid-19: Secondary | ICD-10-CM

## 2019-10-01 LAB — NOVEL CORONAVIRUS, NAA: SARS-CoV-2, NAA: NOT DETECTED

## 2019-10-02 ENCOUNTER — Telehealth: Payer: Self-pay | Admitting: General Practice

## 2019-10-02 NOTE — Telephone Encounter (Signed)
Patient informed of negative covid result. She verbalized understanding. °

## 2019-11-28 ENCOUNTER — Ambulatory Visit: Payer: Managed Care, Other (non HMO) | Attending: Internal Medicine

## 2019-11-28 DIAGNOSIS — Z20822 Contact with and (suspected) exposure to covid-19: Secondary | ICD-10-CM

## 2019-11-30 LAB — NOVEL CORONAVIRUS, NAA: SARS-CoV-2, NAA: NOT DETECTED

## 2020-05-07 ENCOUNTER — Other Ambulatory Visit: Payer: Self-pay

## 2020-05-07 ENCOUNTER — Encounter: Payer: Self-pay | Admitting: Emergency Medicine

## 2020-05-07 ENCOUNTER — Ambulatory Visit
Admission: EM | Admit: 2020-05-07 | Discharge: 2020-05-07 | Disposition: A | Discharge status: Home / Self Care | Payer: 59

## 2020-05-07 DIAGNOSIS — M545 Low back pain, unspecified: Secondary | ICD-10-CM

## 2020-05-07 DIAGNOSIS — M542 Cervicalgia: Secondary | ICD-10-CM | POA: Diagnosis not present

## 2020-05-07 DIAGNOSIS — M25562 Pain in left knee: Secondary | ICD-10-CM

## 2020-05-07 MED ORDER — DICLOFENAC SODIUM 50 MG PO TBEC: 50.0000 mg | DELAYED_RELEASE_TABLET | Freq: Two times a day (BID) | ORAL | 0 refills | Status: AC

## 2020-05-07 MED ORDER — TIZANIDINE HCL 2 MG PO TABS: 2.0000 mg | ORAL_TABLET | Freq: Three times a day (TID) | ORAL | 0 refills | Status: AC | PRN
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
## 2020-09-01 ENCOUNTER — Telehealth (HOSPITAL_COMMUNITY): Payer: Self-pay | Admitting: *Deleted

## 2020-09-01 MED ORDER — FLUOXETINE HCL 20 MG PO CAPS: 20.0000 mg | ORAL_CAPSULE | Freq: Every day | ORAL | 1 refills | Status: AC

## 2020-09-01 NOTE — Addendum Note (Signed)
Addended by: Zena Amos on: 09/01/2020 08:58 AM   Modules accepted: Orders

## 2020-09-01 NOTE — Telephone Encounter (Signed)
Pharmacy request for Fluoxitine 20 mg. Patient not seen here since 6/9 and at that time seen by Lerry Liner NP who is no longer employed here. Will forward request to Dr Evelene Croon and made her an appt here for 11/3.

## 2020-09-01 NOTE — Telephone Encounter (Signed)
Rx sent 

## 2021-01-03 ENCOUNTER — Emergency Department
Admission: EM | Admit: 2021-01-03 | Discharge: 2021-01-03 | Disposition: A | Discharge status: Home / Self Care | Payer: 59 | Source: Home / Self Care | Attending: Family Medicine | Admitting: Family Medicine

## 2021-01-03 ENCOUNTER — Encounter: Payer: Self-pay | Admitting: Emergency Medicine

## 2021-01-03 ENCOUNTER — Other Ambulatory Visit: Payer: Self-pay

## 2021-01-03 DIAGNOSIS — R002 Palpitations: Secondary | ICD-10-CM

## 2021-01-03 HISTORY — DX: COVID-19: U07.1

## 2021-01-03 NOTE — ED Provider Notes (Signed)
[facility]    CSN: [id number] Arrival date & time: 01/03/21  1532      History   Chief Complaint Rapid heart rate  HPI Virginia Bailey is a 65 y.o. female.   Yesterday morning at about 8:30am while taking her dogs outside, patient patient felt some vague pain in her left lower leg and knee.  She then suddenly experienced tightness in her anterior chest, racing heart, sweats, tightness in her left arm, and light-headedness.  Using her pulse oximeter, she measured her heart rate at 186, with a gradual decrease to normal over a period of about 35 minutes.  Her O2 remained at 96-98%.   Her leg discomfort resolved, and today she has some vague soreness in her left arm, but otherwise feels well. Patient had COVID19 one month ago.  She states that she began feeling better last week while taking a course of prednisone that she finished two days ago. She states that she has an appointment to see her PCP in 2 days.  The history is provided by the patient.    Past Medical History:  Diagnosis Date  . Asthma    last treated in 2-3 months  . COVID 12/04/21  . Headache(784.0)    migraines  . Shortness of breath    restrictive pulmonary disease  . Sleep apnea    CPAP    There are no problems to display for this patient.   Past Surgical History:  Procedure Laterality Date  . ABDOMINAL HYSTERECTOMY    . BREAST SURGERY Bilateral    cyst removes  . FOOT SURGERY Left    removal of tumors  . KNEE ARTHROSCOPY Left   . KNEE ARTHROSCOPY WITH EXCISION BAKER'S CYST Right 05/30/2013   Procedure: RIGHT KNEE ARTHROSCOPY WITH ASPIRATION BAKER'S CYST;  Surgeon: Verlee Rossetti, MD;  Location: [facility];  Service: Orthopedics;  Laterality: Right;  Medial, Lateral and Patella Joint Chrondroplasty  . TONSILLECTOMY    . WRIST SURGERY Right    cyst removed    OB History   No obstetric history on file.      Home Medications    Prior to Admission medications   Medication Sig Start  Date End Date Taking? Authorizing Provider  albuterol (PROVENTIL HFA;VENTOLIN HFA) 108 (90 BASE) MCG/ACT inhaler Inhale 2 puffs into the lungs as needed for wheezing.    [provider]  diclofenac (VOLTAREN) 50 MG EC tablet Take 1 tablet (50 mg total) by mouth 2 (two) times daily. 05/07/20   Cathie Hoops, Amy V, PA-C  FLUoxetine (PROZAC) 20 MG capsule Take 1 capsule (20 mg total) by mouth daily. 09/01/20   Zena Amos, MD  fluticasone (FLOVENT HFA) 110 MCG/ACT inhaler Inhale 2 puffs into the lungs daily.    [provider]  SUMAtriptan (IMITREX) 100 MG tablet Take 100 mg by mouth once. May repeat in 2 hours if headache persists or recurs.    [provider]  tiZANidine (ZANAFLEX) 2 MG tablet Take 1 tablet (2 mg total) by mouth every 8 (eight) hours as needed for muscle spasms. 05/07/20   Belinda Fisher, PA-C    Family History CV disease  Social History Social History   Tobacco Use  . Smoking status: Never Smoker  Substance Use Topics  . Alcohol use: Yes    Comment: occasionally wine  . Drug use: No     Allergies   Patient has no known allergies.   Review of Systems Review of Systems  Constitutional: Positive  for activity change. Negative for appetite change, chills, diaphoresis, fatigue and fever.  HENT: Negative.   Eyes: Negative.   Respiratory: Positive for chest tightness. Negative for cough, shortness of breath, wheezing and stridor.   Cardiovascular: Positive for palpitations. Negative for chest pain and leg swelling.  Gastrointestinal: Negative.   Genitourinary: Negative.   Musculoskeletal: Negative.   Skin: Negative.   Neurological: Positive for light-headedness. Negative for dizziness, syncope and headaches.  Hematological: Negative.      Physical Exam Triage Vital Signs ED Triage Vitals  Enc Vitals Group     BP 01/03/21 1552 126/82     Pulse Rate 01/03/21 1552 77     Resp 01/03/21 1552 16     Temp 01/03/21 1552 98.5 F (36.9 C)     Temp Source  01/03/21 1552 Oral     SpO2 01/03/21 1552 97 %     Weight --      Height --      Head Circumference --      Peak Flow --      Pain Score 01/03/21 1557 2     Pain Loc --      Pain Edu? --      Excl. in GC? --    No data found.  Updated Vital Signs BP 126/82 (BP Location: Right Arm)   Pulse 77   Temp 98.5 F (36.9 C) (Oral)   Resp 16   SpO2 97%   Visual Acuity Right Eye Distance:   Left Eye Distance:   Bilateral Distance:    Right Eye Near:   Left Eye Near:    Bilateral Near:     Physical Exam Nursing notes and Vital Signs reviewed. Appearance:  Patient appears stated age, and in no acute distress Eyes:  Pupils are equal, round, and reactive to light and accomodation.  Extraocular movement is intact.  Conjunctivae are not inflamed  Ears:  Canals normal.  Tympanic membranes normal.  Nose:   Normal turbinates.  No sinus tenderness.   Pharynx:  Normal Neck:  Supple.  No adenopathy or thyromegaly Lungs:  Clear to auscultation.  Breath sounds are equal.  Moving air well. Chest:  No tenderness to palpation. Heart:  Regular rate and rhythm without murmurs, rubs, or gallops.  Abdomen:  Nontender without masses or hepatosplenomegaly.  Bowel sounds are present.  No CVA or flank tenderness.  Extremities:  No edema or tenderness to palpation. Skin:  No rash present.   UC Treatments / Results  Labs (all labs ordered are listed, but only abnormal results are displayed) Labs Reviewed  D-DIMER, QUANTITATIVE (NOT AT [facility])    EKG  Rate:  74 BPM PR:  124 msec QT:  392 msec QTcH:  435 msec QRSD:  100 msec QRS axis:  10 degrees Interpretation:  within normal limits; normal sinus rhythm.   Radiology No results found.  Procedures Procedures (including critical care time)  Medications Ordered in UC Medications - No data to display  Initial Impression / Assessment and Plan / UC Course  I have reviewed the triage vital signs and the nursing notes.  Pertinent labs &  imaging results that were available during my care of the patient were reviewed by me and considered in my medical decision making (see chart for details).    Benign exam and normal EKG today. As a precaution, with check d-dimer.  CTA chest if positive. Followup with Family Doctor as scheduled in two days.   Final Clinical Impressions(s) / UC  Diagnoses   Final diagnoses:  Palpitations     Discharge Instructions     If symptoms become significantly worse during the night or over the weekend, proceed to the local emergency room.     ED Prescriptions    None        Lattie Haw, MD 01/04/21 2127

## 2021-01-03 NOTE — Discharge Instructions (Signed)
If symptoms become significantly worse during the night or over the weekend, proceed to the local emergency room.  

## 2021-01-03 NOTE — ED Triage Notes (Signed)
Patient here because yesterday her left arm felt tight; was sweaty; felt heart racing; no known history of heart disease. Today left arm tight and some sub sternal pressure. No signs of distress during triage. Has had covid vaccinations; did have covid 12/04/20 and started feeling better last week.

## 2021-01-04 LAB — D-DIMER, QUANTITATIVE: D-Dimer, Quant: 0.32 mcg/mL FEU (ref <0.50)

## 2021-05-25 ENCOUNTER — Other Ambulatory Visit: Payer: Self-pay

## 2021-05-25 ENCOUNTER — Emergency Department (HOSPITAL_BASED_OUTPATIENT_CLINIC_OR_DEPARTMENT_OTHER)
Admission: EM | Admit: 2021-05-25 | Discharge: 2021-05-25 | Disposition: A | Discharge status: Home / Self Care | Payer: 59 | Attending: Emergency Medicine | Admitting: Emergency Medicine

## 2021-05-25 ENCOUNTER — Encounter (HOSPITAL_BASED_OUTPATIENT_CLINIC_OR_DEPARTMENT_OTHER): Payer: Self-pay

## 2021-05-25 DIAGNOSIS — Z5321 Procedure and treatment not carried out due to patient leaving prior to being seen by health care provider: Secondary | ICD-10-CM | POA: Diagnosis not present

## 2021-05-25 DIAGNOSIS — R110 Nausea: Secondary | ICD-10-CM | POA: Diagnosis not present

## 2021-05-25 DIAGNOSIS — R42 Dizziness and giddiness: Secondary | ICD-10-CM | POA: Diagnosis not present

## 2021-05-25 NOTE — ED Triage Notes (Signed)
Pt reports feeling dizzy starting this am.  Has had some associated nausea, but denies vomiting.  Had a headache earlier today but reports this has resolved.

## 2023-02-02 ENCOUNTER — Encounter (HOSPITAL_BASED_OUTPATIENT_CLINIC_OR_DEPARTMENT_OTHER): Payer: Self-pay

## 2023-02-02 ENCOUNTER — Other Ambulatory Visit: Payer: Self-pay

## 2023-02-02 ENCOUNTER — Emergency Department (HOSPITAL_BASED_OUTPATIENT_CLINIC_OR_DEPARTMENT_OTHER)
Admission: EM | Admit: 2023-02-02 | Discharge: 2023-02-02 | Disposition: A | Discharge status: Home / Self Care | Payer: 59 | Attending: Emergency Medicine | Admitting: Emergency Medicine

## 2023-02-02 ENCOUNTER — Other Ambulatory Visit (HOSPITAL_BASED_OUTPATIENT_CLINIC_OR_DEPARTMENT_OTHER): Payer: Self-pay

## 2023-02-02 ENCOUNTER — Emergency Department (HOSPITAL_BASED_OUTPATIENT_CLINIC_OR_DEPARTMENT_OTHER): Payer: 59

## 2023-02-02 DIAGNOSIS — R002 Palpitations: Secondary | ICD-10-CM | POA: Diagnosis present

## 2023-02-02 DIAGNOSIS — I4891 Unspecified atrial fibrillation: Secondary | ICD-10-CM | POA: Diagnosis not present

## 2023-02-02 DIAGNOSIS — Z7901 Long term (current) use of anticoagulants: Secondary | ICD-10-CM | POA: Diagnosis not present

## 2023-02-02 LAB — BASIC METABOLIC PANEL
Anion gap: 10 (ref 5–15)
BUN: 13 mg/dL (ref 8–23)
CO2: 22 mmol/L (ref 22–32)
Calcium: 9.4 mg/dL (ref 8.9–10.3)
Chloride: 104 mmol/L (ref 98–111)
Creatinine, Ser: 0.91 mg/dL (ref 0.44–1.00)
GFR, Estimated: >60 mL/min (ref >60)
Glucose, Bld: 130 mg/dL — ABNORMAL HIGH (ref 70–99)
Potassium: 3.9 mmol/L (ref 3.5–5.1)
Sodium: 136 mmol/L (ref 135–145)

## 2023-02-02 LAB — CBC WITH DIFFERENTIAL/PLATELET
Abs Immature Granulocytes: 0.03 10*3/uL (ref 0.00–0.07)
Basophils Absolute: 0 10*3/uL (ref 0.0–0.1)
Basophils Relative: 1 %
Eosinophils Absolute: 0.1 10*3/uL (ref 0.0–0.5)
Eosinophils Relative: 2 %
HCT: 39.2 % (ref 36.0–46.0)
Hemoglobin: 12.8 g/dL (ref 12.0–15.0)
Immature Granulocytes: 0 %
Lymphocytes Relative: 31 %
Lymphs Abs: 2.1 10*3/uL (ref 0.7–4.0)
MCH: 26.8 pg (ref 26.0–34.0)
MCHC: 32.7 g/dL (ref 30.0–36.0)
MCV: 82 fL (ref 80.0–100.0)
Monocytes Absolute: 0.4 10*3/uL (ref 0.1–1.0)
Monocytes Relative: 5 %
Neutro Abs: 4.3 10*3/uL (ref 1.7–7.7)
Neutrophils Relative %: 61 %
Platelets: 260 10*3/uL (ref 150–400)
RBC: 4.78 MIL/uL (ref 3.87–5.11)
RDW: 15 % (ref 11.5–15.5)
WBC: 7 10*3/uL (ref 4.0–10.5)
nRBC: 0 % (ref 0.0–0.2)

## 2023-02-02 LAB — T4, FREE: Free T4: 0.87 ng/dL (ref 0.61–1.12)

## 2023-02-02 LAB — TSH: TSH: 1.899 u[IU]/mL (ref 0.350–4.500)

## 2023-02-02 LAB — MAGNESIUM: Magnesium: 1.8 mg/dL (ref 1.7–2.4)

## 2023-02-02 MED ORDER — DILTIAZEM HCL-DEXTROSE 125-5 MG/125ML-% IV SOLN (PREMIX)
5.0000 mg/h | INTRAVENOUS | Status: DC
  Administered 2023-02-02: 5 mg/h via INTRAVENOUS
  Filled 2023-02-02: qty 125

## 2023-02-02 MED ORDER — DILTIAZEM HCL-DEXTROSE 125-5 MG/125ML-% IV SOLN (PREMIX): 5.0000 mg/h | INTRAVENOUS | Status: DC

## 2023-02-02 MED ORDER — DILTIAZEM LOAD VIA INFUSION: 15.0000 mg | Freq: Once | INTRAVENOUS | Status: DC

## 2023-02-02 MED ORDER — METOPROLOL TARTRATE 25 MG PO TABS
25.0000 mg | ORAL_TABLET | Freq: Once | ORAL | Status: AC
  Administered 2023-02-02: 25 mg via ORAL
  Filled 2023-02-02: qty 1

## 2023-02-02 MED ORDER — DILTIAZEM LOAD VIA INFUSION
15.0000 mg | Freq: Once | INTRAVENOUS | Status: AC
  Administered 2023-02-02: 15 mg via INTRAVENOUS
  Filled 2023-02-02: qty 15

## 2023-02-02 MED ORDER — METOPROLOL TARTRATE 25 MG PO TABS: 25.0000 mg | ORAL_TABLET | Freq: Two times a day (BID) | ORAL | 0 refills | Status: AC

## 2023-02-02 NOTE — ED Triage Notes (Signed)
Patient here POV from Home.  Endorses SOB and Tachycardia that began this AM. History of A. Fib and was cardioverted twice Last Week.  A Few instances of Chest Discomfort.   NAD Noted during Triage. A&Ox4. GCS 15. Ambulatory.

## 2023-02-02 NOTE — ED Provider Notes (Signed)
[facility] Provider Note   CSN: [id number] Arrival date & time: 02/02/23  1114     History  Chief Complaint  Patient presents with   Shortness of Breath    Virginia Bailey is a 66 y.o. female.  Patient here with tachycardia and palpitations and concern for A-fib being uncontrolled again.  She has had 2 cardioversions in the last 2 weeks.  She has been on diltiazem which has now been stopped and she is on flecainide which was recently increased from 100-150 twice daily.  She felt her heart rate going fast intermittently in the last few days but then worse over the last 24 hours.  Making her feel short of breath and with some chest discomfort.  She follows with cardiology at [facility].  She denies any fever, cough, sputum production.  She has been on Eliquis.  The history is provided by the patient.       Home Medications Prior to Admission medications   Medication Sig Start Date End Date Taking? Authorizing Provider  metoprolol tartrate (LOPRESSOR) 25 MG tablet Take 1 tablet (25 mg total) by mouth 2 (two) times daily. 02/02/23 03/04/23 Yes Marsi Turvey, DO  albuterol (PROVENTIL HFA;VENTOLIN HFA) 108 (90 BASE) MCG/ACT inhaler Inhale 2 puffs into the lungs as needed for wheezing.    [provider]  diclofenac (VOLTAREN) 50 MG EC tablet Take 1 tablet (50 mg total) by mouth 2 (two) times daily. 05/07/20   Tasia Catchings, Amy V, PA-C  FLUoxetine (PROZAC) 20 MG capsule Take 1 capsule (20 mg total) by mouth daily. 09/01/20   [place] Crane, MD  fluticasone (FLOVENT HFA) 110 MCG/ACT inhaler Inhale 2 puffs into the lungs daily.    [provider]  SUMAtriptan (IMITREX) 100 MG tablet Take 100 mg by mouth once. May repeat in 2 hours if headache persists or recurs.    [provider]  tiZANidine (ZANAFLEX) 2 MG tablet Take 1 tablet (2 mg total) by mouth every 8 (eight) hours as needed for muscle spasms. 05/07/20   Ok Edwards, PA-C       Allergies    Tramadol    Review of Systems   Review of Systems  Physical Exam Updated Vital Signs BP (!) 138/93   Pulse (!) 105   Temp 97.9 F (36.6 C)   Resp 17   Ht '5\' 8"'$  (1.727 m)   Wt 94.3 kg   SpO2 98%   BMI 31.63 kg/m  Physical Exam Vitals and nursing note reviewed.  Constitutional:      General: She is not in acute distress.    Appearance: She is well-developed. She is not ill-appearing.  HENT:     Head: Normocephalic and atraumatic.  Eyes:     Conjunctiva/sclera: Conjunctivae normal.  Cardiovascular:     Rate and Rhythm: Tachycardia present. Rhythm irregular.     Pulses: Normal pulses.     Heart sounds: Normal heart sounds. No murmur heard. Pulmonary:     Effort: Pulmonary effort is normal. No respiratory distress.     Breath sounds: Normal breath sounds. No decreased breath sounds.  Abdominal:     Palpations: Abdomen is soft.     Tenderness: There is no abdominal tenderness.  Musculoskeletal:        General: No swelling.     Cervical back: Normal range of motion and neck supple.     Right lower leg: No edema.     Left lower leg: No edema.  Skin:    General: Skin is warm and dry.     Capillary Refill: Capillary refill takes less than 2 seconds.  Neurological:     General: No focal deficit present.     Mental Status: She is alert.  Psychiatric:        Mood and Affect: Mood normal.     ED Results / Procedures / Treatments   Labs (all labs ordered are listed, but only abnormal results are displayed) Labs Reviewed  BASIC METABOLIC PANEL - Abnormal; Notable for the following components:      Result Value   Glucose, Bld 130 (*)    All other components within normal limits  CBC WITH DIFFERENTIAL/PLATELET  MAGNESIUM  TSH  T4, FREE    EKG EKG Interpretation  Date/Time:  Friday February 02 2023 11:26:55 EST Ventricular Rate:  162 PR Interval:    QRS Duration: 121 QT Interval:  323 QTC Calculation: 531 R Axis:   118 Text  Interpretation: atrial fibrillation with rvr RBBB and LPFB Baseline wander in lead(s) V5 Confirmed by Lennice Sites ([id number]) on 02/02/2023 11:33:38 AM  Radiology DG Chest Portable 1 View  Result Date: 02/02/2023 CLINICAL DATA:  Pain EXAM: PORTABLE CHEST 1 VIEW COMPARISON:  CXR 01/06/21 FINDINGS: No pleural effusion. No pneumothorax. No focal airspace opacity. No radiographically apparent displaced rib fractures. Visualized upper abdomen is unremarkable. IMPRESSION: No focal airspace opacity. Electronically Signed   By: Marin Roberts M.D.   On: 02/02/2023 11:59    Procedures .Critical Care  Performed by: Lennice Sites, DO Authorized by: Lennice Sites, DO   Critical care provider statement:    Critical care time (minutes):  35   Critical care was necessary to treat or prevent imminent or life-threatening deterioration of the following conditions: atrial fibrillation with rvr.   Critical care was time spent personally by me on the following activities:  Blood draw for specimens, discussions with consultants, development of treatment plan with patient or surrogate, discussions with primary provider, examination of patient, evaluation of patient's response to treatment, obtaining history from patient or surrogate, ordering and review of laboratory studies, ordering and performing treatments and interventions, pulse oximetry, re-evaluation of patient's condition, ordering and review of radiographic studies and review of old charts   I assumed direction of critical care for this patient from another provider in my specialty: no       Medications Ordered in ED Medications  diltiazem (CARDIZEM) 1 mg/mL load via infusion 15 mg (15 mg Intravenous Bolus from Bag 02/02/23 1213)    And  diltiazem (CARDIZEM) 125 mg in dextrose 5% 125 mL (1 mg/mL) infusion (0 mg/hr Intravenous Stopped 02/02/23 1337)  metoprolol tartrate (LOPRESSOR) tablet 25 mg (25 mg Oral Given 02/02/23 1255)    ED Course/ Medical Decision  Making/ A&P                             Medical Decision Making Amount and/or Complexity of Data Reviewed Labs: ordered. Radiology: ordered.  Risk Prescription drug management.   Abel Presto is here with palpitations and tachycardia.  History of atrial fibrillation on Eliquis and flecainide.  She had 2 cardioversions in the last 2 weeks.  Had previously been on diltiazem but could not tolerate it and now has been on flecainide with increasing dose of flecainide from 100-1 50 twice a day.  She [facility] with [facility]ology [facility] at [facility].  She arrives today with atrial fibrillation with RVR  per my review and interpretation of EKG.  She is having some chest pain and shortness of breath.  Sounds like she has failed cardioversion.  Sounds like she has had discussion with cardiology about maybe needing an ablation if she does not do well with her current regimen.  Talked with Dr. Warren Lacy cardiology and ultimately we will start her on IV diltiazem bolus and infusion.  Will check basic labs.  We can get her more rate controlled maybe we can try to figure out how to switch her over to a p.o. regimen but plan will be for observation stay on diltiazem to try to get her on a more stable p.o. regimen.  They will consult when patient is admitted to facility.  Will check basic labs, thyroid function to see if there is any trigger.  She not having any current chest pain.  Chest x-ray done per my review and interpretation shows no signs of volume overload or pneumonia.  Per my review and interpretation of labs no significant anemia or electrolyte abnormality or kidney injury.  After some time on diltiazem infusion her heart rate now more in the low 100s.  Symptoms have improved.  Talk to cardiology and we will try to switch her over to oral metoprolol to add to her flecainide.  Will give her a dose of oral metoprolol to tartrate 25 mg.  After period of observation after stopping diltiazem drip and having  initiated oral beta-blocker patient has held heart rate in the 90-1 05 range.  She is asymptomatic.  Will start metoprolol 25 mg twice a day number follow-up with her cardiologist.  She understands return precautions.  This chart was dictated using voice recognition software.  Despite best efforts to proofread,  errors can occur which can change the documentation meaning.         Final Clinical Impression(s) / ED Diagnoses Final diagnoses:  Atrial fibrillation with RVR ([place])    Rx / DC Orders ED Discharge Orders          Ordered    metoprolol tartrate (LOPRESSOR) 25 MG tablet  2 times daily        02/02/23 1415              [place], DO 02/02/23 1416

## 2023-02-02 NOTE — Discharge Instructions (Signed)
Follow-up with your cardiologist.  Return if symptoms worsen as discussed.

## 2023-03-21 ENCOUNTER — Other Ambulatory Visit: Payer: Self-pay

## 2023-03-21 ENCOUNTER — Emergency Department (HOSPITAL_BASED_OUTPATIENT_CLINIC_OR_DEPARTMENT_OTHER)
Admission: EM | Admit: 2023-03-21 | Discharge: 2023-03-22 | Disposition: A | Discharge status: Home / Self Care | Payer: 59 | Attending: Emergency Medicine | Admitting: Emergency Medicine

## 2023-03-21 ENCOUNTER — Emergency Department (HOSPITAL_BASED_OUTPATIENT_CLINIC_OR_DEPARTMENT_OTHER): Payer: 59

## 2023-03-21 DIAGNOSIS — Z7901 Long term (current) use of anticoagulants: Secondary | ICD-10-CM | POA: Insufficient documentation

## 2023-03-21 DIAGNOSIS — Z7951 Long term (current) use of inhaled steroids: Secondary | ICD-10-CM | POA: Diagnosis not present

## 2023-03-21 DIAGNOSIS — K8020 Calculus of gallbladder without cholecystitis without obstruction: Secondary | ICD-10-CM

## 2023-03-21 DIAGNOSIS — R112 Nausea with vomiting, unspecified: Secondary | ICD-10-CM | POA: Insufficient documentation

## 2023-03-21 DIAGNOSIS — J45909 Unspecified asthma, uncomplicated: Secondary | ICD-10-CM | POA: Insufficient documentation

## 2023-03-21 DIAGNOSIS — R7401 Elevation of levels of liver transaminase levels: Secondary | ICD-10-CM | POA: Diagnosis not present

## 2023-03-21 DIAGNOSIS — Z79899 Other long term (current) drug therapy: Secondary | ICD-10-CM | POA: Insufficient documentation

## 2023-03-21 DIAGNOSIS — R1013 Epigastric pain: Secondary | ICD-10-CM | POA: Diagnosis present

## 2023-03-21 DIAGNOSIS — I10 Essential (primary) hypertension: Secondary | ICD-10-CM | POA: Diagnosis not present

## 2023-03-21 DIAGNOSIS — I4891 Unspecified atrial fibrillation: Secondary | ICD-10-CM | POA: Diagnosis not present

## 2023-03-21 LAB — COMPREHENSIVE METABOLIC PANEL
ALT: 54 U/L — ABNORMAL HIGH (ref 0–44)
AST: 102 U/L — ABNORMAL HIGH (ref 15–41)
Albumin: 4.1 g/dL (ref 3.5–5.0)
Alkaline Phosphatase: 157 U/L — ABNORMAL HIGH (ref 38–126)
Anion gap: 9 (ref 5–15)
BUN: 11 mg/dL (ref 8–23)
CO2: 25 mmol/L (ref 22–32)
Calcium: 9.5 mg/dL (ref 8.9–10.3)
Chloride: 103 mmol/L (ref 98–111)
Creatinine, Ser: 0.85 mg/dL (ref 0.44–1.00)
GFR, Estimated: >60 mL/min (ref >60)
Glucose, Bld: 105 mg/dL — ABNORMAL HIGH (ref 70–99)
Potassium: 4 mmol/L (ref 3.5–5.1)
Sodium: 137 mmol/L (ref 135–145)
Total Bilirubin: 1 mg/dL (ref 0.3–1.2)
Total Protein: 6.9 g/dL (ref 6.5–8.1)

## 2023-03-21 LAB — CBC
HCT: 33.3 % — ABNORMAL LOW (ref 36.0–46.0)
Hemoglobin: 10.7 g/dL — ABNORMAL LOW (ref 12.0–15.0)
MCH: 26.8 pg (ref 26.0–34.0)
MCHC: 32.1 g/dL (ref 30.0–36.0)
MCV: 83.5 fL (ref 80.0–100.0)
Platelets: 204 10*3/uL (ref 150–400)
RBC: 3.99 MIL/uL (ref 3.87–5.11)
RDW: 15.9 % — ABNORMAL HIGH (ref 11.5–15.5)
WBC: 6.3 10*3/uL (ref 4.0–10.5)
nRBC: 0 % (ref 0.0–0.2)

## 2023-03-21 LAB — LIPASE, BLOOD: Lipase: 50 U/L (ref 11–51)

## 2023-03-21 MED ORDER — ONDANSETRON HCL 4 MG/2ML IJ SOLN
4.0000 mg | Freq: Once | INTRAMUSCULAR | Status: AC
  Administered 2023-03-21: 4 mg via INTRAVENOUS
  Filled 2023-03-21: qty 2

## 2023-03-21 MED ORDER — MORPHINE SULFATE (PF) 4 MG/ML IV SOLN
4.0000 mg | Freq: Once | INTRAVENOUS | Status: AC
  Administered 2023-03-21: 4 mg via INTRAVENOUS
  Filled 2023-03-21: qty 1

## 2023-03-21 NOTE — ED Provider Notes (Signed)
[place] EMERGENCY DEPARTMENT AT [facility] Provider Note   CSN: [id number] Arrival date & time: 03/21/23  1834     History  Chief Complaint  Patient presents with   Abdominal Pain    Virginia Bailey is a 67 y.o. female.  With PMH of obesity, asthma who presents with epigastrium pain.   Abdominal Pain      Home Medications Prior to Admission medications   Medication Sig Start Date End Date Taking? Authorizing Provider  albuterol (PROVENTIL HFA;VENTOLIN HFA) 108 (90 BASE) MCG/ACT inhaler Inhale 2 puffs into the lungs as needed for wheezing.    [provider]  diclofenac (VOLTAREN) 50 MG EC tablet Take 1 tablet (50 mg total) by mouth 2 (two) times daily. 05/07/20   Cathie Hoops, Amy V, PA-C  FLUoxetine (PROZAC) 20 MG capsule Take 1 capsule (20 mg total) by mouth daily. 09/01/20   Zena Amos, MD  fluticasone (FLOVENT HFA) 110 MCG/ACT inhaler Inhale 2 puffs into the lungs daily.    [provider]  metoprolol tartrate (LOPRESSOR) 25 MG tablet Take 1 tablet (25 mg total) by mouth 2 (two) times daily. 02/02/23 03/04/23  Curatolo, Adam, DO  SUMAtriptan (IMITREX) 100 MG tablet Take 100 mg by mouth once. May repeat in 2 hours if headache persists or recurs.    [provider]  tiZANidine (ZANAFLEX) 2 MG tablet Take 1 tablet (2 mg total) by mouth every 8 (eight) hours as needed for muscle spasms. 05/07/20   Belinda Fisher, PA-C      Allergies    Tramadol    Review of Systems   Review of Systems  Gastrointestinal:  Positive for abdominal pain.    Physical Exam Updated Vital Signs BP (!) 145/72   Pulse 71   Resp 16   Ht 5\' 8"  (1.727 m)   Wt 99.8 kg   SpO2 99%   BMI 33.45 kg/m  Physical Exam  ED Results / Procedures / Treatments   Labs (all labs ordered are listed, but only abnormal results are displayed) Labs Reviewed  COMPREHENSIVE METABOLIC PANEL - Abnormal; Notable for the following components:      Result Value   Glucose, Bld 105 (*)    AST  102 (*)    ALT 54 (*)    Alkaline Phosphatase 157 (*)    All other components within normal limits  CBC - Abnormal; Notable for the following components:   Hemoglobin 10.7 (*)    HCT 33.3 (*)    RDW 15.9 (*)    All other components within normal limits  LIPASE, BLOOD    EKG EKG Interpretation  Date/Time:  Wednesday March 21 2023 18:52:35 EDT Ventricular Rate:  75 PR Interval:  140 QRS Duration: 94 QT Interval:  426 QTC Calculation: 475 R Axis:   38 Text Interpretation: Sinus rhythm with occasional Premature ventricular complexes Low voltage QRS Cannot rule out Anterior infarct , age undetermined Abnormal ECG When compared with ECG of 02-Feb-2023 11:26, PREVIOUS ECG IS PRESENT Confirmed by Vivien Rossetti ([id number]) on 03/21/2023 6:58:13 PM  Radiology No results found.  Procedures Procedures  {Document cardiac monitor, telemetry assessment procedure when appropriate:1}  Medications Ordered in ED Medications - No data to display  ED Course/ Medical Decision Making/ A&P   {   Click here for ABCD2, HEART and other calculatorsREFRESH Note before signing :1}  Medical Decision Making Amount and/or Complexity of Data Reviewed Labs: ordered. Radiology: ordered.   ***  {Document critical care time when appropriate:1} {Document review of labs and clinical decision tools ie heart score, Chads2Vasc2 etc:1}  {Document your independent review of radiology images, and any outside records:1} {Document your discussion with family members, caretakers, and with consultants:1} {Document social determinants of health affecting pt's care:1} {Document your decision making why or why not admission, treatments were needed:1} Final Clinical Impression(s) / ED Diagnoses Final diagnoses:  None    Rx / DC Orders ED Discharge Orders     None

## 2023-03-21 NOTE — ED Triage Notes (Signed)
Pt arrived POV, caox4, ambulatory. Pt c/o epigastric abd pain radiating to the back which worsens on exertion or movement. Pt also c/o N/V. S/S started shortly after eating fast food.   Pt advised she had cardiac ablation done last Friday, denies any complications or other complaints since procedure.

## 2023-03-22 MED ORDER — CIPROFLOXACIN HCL 500 MG PO TABS: 500.0000 mg | ORAL_TABLET | Freq: Once | ORAL | Status: DC

## 2023-03-22 MED ORDER — OXYCODONE HCL 5 MG PO TABS: 5.0000 mg | ORAL_TABLET | Freq: Four times a day (QID) | ORAL | 0 refills | Status: DC | PRN

## 2023-03-22 MED ORDER — CEFDINIR 300 MG PO CAPS
300.0000 mg | ORAL_CAPSULE | Freq: Two times a day (BID) | ORAL | Status: DC
  Administered 2023-03-22: 300 mg via ORAL
  Filled 2023-03-22: qty 1

## 2023-03-22 MED ORDER — METRONIDAZOLE 500 MG PO TABS: 500.0000 mg | ORAL_TABLET | Freq: Two times a day (BID) | ORAL | 0 refills | Status: AC

## 2023-03-22 MED ORDER — CEFDINIR 300 MG PO CAPS: 300.0000 mg | ORAL_CAPSULE | Freq: Two times a day (BID) | ORAL | 0 refills | Status: AC

## 2023-03-22 MED ORDER — ONDANSETRON 4 MG PO TBDP: 4.0000 mg | ORAL_TABLET | Freq: Three times a day (TID) | ORAL | 0 refills | Status: AC | PRN

## 2023-03-22 MED ORDER — METRONIDAZOLE 500 MG PO TABS
500.0000 mg | ORAL_TABLET | Freq: Once | ORAL | Status: AC
  Administered 2023-03-22: 500 mg via ORAL
  Filled 2023-03-22: qty 1

## 2023-03-22 NOTE — Discharge Instructions (Addendum)
You have been seen in the Emergency Department (ED) for abdominal pain.  Your ultrasound showed that you do have gallstones but your pain resolved and you able to drink in the emergency department without any nausea or vomiting. Currently, you do not require an emergent procedure to remove your gallbladder.   Take the Flagyl and cefdinir as prescribed for the next 7 days.  We have referred you to surgery for outpatient follow up. It is very important to follow up in clinic to schedule surgery. Please call to schedule an appointment with the surgery team.  Let them know that you are seen in the emergency department today for biliary colic.   You can take Tylenol and Ibuprofen as needed for pain at home and we are giving you a prescription for oxycodone which you can use as needed for breakthrough pain.  Eating a low-fat diet may also help control your pain.  Return to the ED if your abdominal pain worsens or fails to improve with pain medication, if you develop worsening vomiting, if you are unable to tolerate fluids due to vomiting, fever greater than 101, or other symptoms that concern you.

## 2023-07-02 ENCOUNTER — Ambulatory Visit (INDEPENDENT_AMBULATORY_CARE_PROVIDER_SITE_OTHER): Payer: 59

## 2023-07-02 ENCOUNTER — Other Ambulatory Visit: Payer: Self-pay

## 2023-07-02 ENCOUNTER — Encounter: Payer: Self-pay | Admitting: Podiatry

## 2023-07-02 ENCOUNTER — Ambulatory Visit: Payer: 59 | Admitting: Podiatry

## 2023-07-02 VITALS — BP 128/78 | HR 71

## 2023-07-02 DIAGNOSIS — S92422A Displaced fracture of distal phalanx of left great toe, initial encounter for closed fracture: Secondary | ICD-10-CM

## 2023-07-02 DIAGNOSIS — M2042 Other hammer toe(s) (acquired), left foot: Secondary | ICD-10-CM | POA: Diagnosis not present

## 2023-07-02 DIAGNOSIS — M7989 Other specified soft tissue disorders: Secondary | ICD-10-CM

## 2023-07-02 DIAGNOSIS — M779 Enthesopathy, unspecified: Secondary | ICD-10-CM | POA: Diagnosis not present

## 2023-07-02 MED ORDER — HYDROCODONE-ACETAMINOPHEN 5-325 MG PO TABS: 1.0000 | ORAL_TABLET | Freq: Four times a day (QID) | ORAL | 0 refills | Status: DC | PRN

## 2023-07-02 MED ORDER — METHYLPREDNISOLONE 4 MG PO TBPK: ORAL_TABLET | ORAL | 0 refills | Status: AC

## 2023-07-02 NOTE — Progress Notes (Unsigned)
Subjective:   Patient ID: Virginia Bailey, female   DOB: 67 y.o.   MRN: [id number]   HPI Chief Complaint  Patient presents with   Foot Pain    "I had a cyst pop up on my foot on Thursday.  My foot is killing me." N - painful cyst L - lateral right D - Thursday O - suddenly C - feels like a knife jabbing in it, sharp pain A - walking, standing, shoes, pressure, cover laying on it T - Urgent Care, X-rays, Tylenol   67 year old female presents the office for above concerns.  She states that symptoms in the right foot started on Thursday when she got out of her truck and she notes that sharp, stabbing pain to the foot.  At first she thought something bit her.  She went to urgent care and cardiac x-rays were taken and was told they were normal and she had a cyst.  Then on Saturday she states that she broke her left big toe after a wash machine drop in her toe.  No treatment to the left foot.  Review of Systems  All other systems reviewed and are negative.  Past Medical History:  Diagnosis Date   Asthma    last treated in 2-3 months   COVID 12/04/21   Headache(784.0)    migraines   Shortness of breath    restrictive pulmonary disease   Sleep apnea    CPAP    Past Surgical History:  Procedure Laterality Date   ABDOMINAL HYSTERECTOMY     BREAST SURGERY Bilateral    cyst removes   FOOT SURGERY Left    removal of tumors   KNEE ARTHROSCOPY Left    KNEE ARTHROSCOPY WITH EXCISION BAKER'S CYST Right 05/30/2013   Procedure: RIGHT KNEE ARTHROSCOPY WITH ASPIRATION BAKER'S CYST;  Surgeon: Verlee Rossetti, MD;  Location: [facility] OR;  Service: Orthopedics;  Laterality: Right;  Medial, Lateral and Patella Joint Chrondroplasty   TONSILLECTOMY     WRIST SURGERY Right    cyst removed     Current Outpatient Medications:    albuterol (PROVENTIL HFA;VENTOLIN HFA) 108 (90 BASE) MCG/ACT inhaler, Inhale 2 puffs into the lungs as needed for wheezing., Disp: , Rfl:    diclofenac (VOLTAREN) 50 MG EC  tablet, Take 1 tablet (50 mg total) by mouth 2 (two) times daily., Disp: 20 tablet, Rfl: 0   FLUoxetine (PROZAC) 20 MG capsule, Take 1 capsule (20 mg total) by mouth daily., Disp: 30 capsule, Rfl: 1   fluticasone (FLOVENT HFA) 110 MCG/ACT inhaler, Inhale 2 puffs into the lungs daily., Disp: , Rfl:    HYDROcodone-acetaminophen (NORCO/VICODIN) 5-325 MG tablet, Take 1 tablet by mouth every 6 (six) hours as needed., Disp: 15 tablet, Rfl: 0   methylPREDNISolone (MEDROL DOSEPAK) 4 MG TBPK tablet, Take as directed, Disp: 21 tablet, Rfl: 0   metroNIDAZOLE (FLAGYL) 500 MG tablet, Take 1 tablet (500 mg total) by mouth 2 (two) times daily., Disp: 13 tablet, Rfl: 0   ondansetron (ZOFRAN-ODT) 4 MG disintegrating tablet, Take 1 tablet (4 mg total) by mouth every 8 (eight) hours as needed., Disp: 20 tablet, Rfl: 0   SUMAtriptan (IMITREX) 100 MG tablet, Take 100 mg by mouth once. May repeat in 2 hours if headache persists or recurs., Disp: , Rfl:    metoprolol tartrate (LOPRESSOR) 25 MG tablet, Take 1 tablet (25 mg total) by mouth 2 (two) times daily., Disp: 60 tablet, Rfl: 0   tiZANidine (ZANAFLEX) 2 MG tablet, Take 1  tablet (2 mg total) by mouth every 8 (eight) hours as needed for muscle spasms. (Patient not taking: Reported on 07/02/2023), Disp: 15 tablet, Rfl: 0  Allergies  Allergen Reactions   Tramadol           Objective:  Physical Exam  General: AAO x3, NAD  Dermatological: No open lesions are noted.  There is ecchymosis present to left hallux there is no open lesions.  There is no significant subungual hematoma present left hallux toenail.  Nails adhered to the nailbed.  Vascular: Dorsalis Pedis artery and Posterior Tibial artery pedal pulses are 2/4 bilateral with immedate capillary fill time. There is no pain with calf compression, swelling, warmth, erythema.   Neruologic: Grossly intact via light touch bilateral.   Musculoskeletal: Tenderness to palpation on dorsal lateral aspect of the  patient's right foot and there is a small fluid-filled cyst noted there is tenderness palpation of this area but her tenderness extends more globally around the foot and she has pinpoint tenderness on the metatarsals as well.  Flexor, extensor tendons clinically fairly intact.  No significant pain of right ankle.  Tenderness to the left hallux distally.  Gait: Unassisted, Nonantalgic.       Assessment:   67 year old female left hallux fracture; concern for possible stress fracture right foot     Plan:  -Treatment options discussed including all alternatives, risks, and complications -Etiology of symptoms were discussed -X-rays were obtained and reviewed with the patient.  3 views bilateral feet were obtained.  On the left foot there is a fracture of the distal tuft of the distal phalanx.  On the right but there is no significant evidence of acute fracture noted. -On the right foot recommend immobilization in a surgical shoe given her symptoms, swelling and pain.  Start Medrol Dosepak.  At this time there is not significant fluid unable to follow-up.  However if symptoms persist we will try an aspiration. -Surgical shoe for left foot given fracture.  Return in about 3 weeks (around 07/23/2023) for foot pain right; toe fracture left.  X-ray bilateral feet  Vivi Barrack DPM

## 2023-07-09 ENCOUNTER — Ambulatory Visit: Payer: 59 | Admitting: Podiatry

## 2023-07-13 ENCOUNTER — Other Ambulatory Visit: Payer: Self-pay | Admitting: Podiatry

## 2023-07-13 ENCOUNTER — Telehealth: Payer: Self-pay | Admitting: Podiatry

## 2023-07-13 MED ORDER — HYDROCODONE-ACETAMINOPHEN 5-325 MG PO TABS: 1.0000 | ORAL_TABLET | Freq: Four times a day (QID) | ORAL | 0 refills | Status: AC | PRN

## 2023-07-13 NOTE — Telephone Encounter (Signed)
Pt requested a Rx refill on her pain medicine. Please advise

## 2023-07-23 ENCOUNTER — Ambulatory Visit: Payer: 59 | Admitting: Podiatry
